# Patient Record
Sex: Female | Born: 1990 | Race: White | Hispanic: No | Smoking: Current every day smoker
Health system: Southern US, Community
[De-identification: ages and names within clinical notes are randomized; demographics above are authoritative.]

## PROBLEM LIST (undated history)

## (undated) ENCOUNTER — Inpatient Hospital Stay (HOSPITAL_COMMUNITY): Payer: Self-pay

## (undated) DIAGNOSIS — F32A Depression, unspecified: Secondary | ICD-10-CM

## (undated) DIAGNOSIS — R51 Headache: Secondary | ICD-10-CM

## (undated) DIAGNOSIS — D649 Anemia, unspecified: Secondary | ICD-10-CM

## (undated) DIAGNOSIS — R519 Headache, unspecified: Secondary | ICD-10-CM

## (undated) DIAGNOSIS — F329 Major depressive disorder, single episode, unspecified: Secondary | ICD-10-CM

## (undated) DIAGNOSIS — F319 Bipolar disorder, unspecified: Secondary | ICD-10-CM

## (undated) DIAGNOSIS — F419 Anxiety disorder, unspecified: Secondary | ICD-10-CM

## (undated) DIAGNOSIS — K219 Gastro-esophageal reflux disease without esophagitis: Secondary | ICD-10-CM

## (undated) HISTORY — PX: TYMPANOSTOMY TUBE PLACEMENT: SHX32

## (undated) HISTORY — DX: Bipolar disorder, unspecified: F31.9

---

## 2014-08-30 NOTE — L&D Delivery Note (Signed)
Patient is 24 y.o. [id number] [redacted]w[redacted]d admitted for PPROM three days ago, with subsequent IOL for suspected chorio.   Delivery Note At 2:55 PM a viable female was delivered via vaginal delivery (Presentation: occiput; anterior).  APGAR: 9, 7; weight 4 lb 5.1 oz (1960 g).   Placenta status: spontaneous; placental remnant was manually retrieved from uterus. 1000 mcg cytotec was subsequently administered rectally. Placenta was sent to pathology. Cord: 3 vessels with the following complications: none.    Anesthesia: Epidural  Episiotomy:  None Lacerations:  None Est. Blood Loss (mL):  500  Mom to postpartum.  Baby to NICU.  Tarri Abernethy, MD PGY-1 [facility]  05/14/2015, 3:21 PM

## 2014-12-05 LAB — OB RESULTS CONSOLE GC/CHLAMYDIA
CHLAMYDIA, DNA PROBE: NEGATIVE
Gonorrhea: NEGATIVE

## 2014-12-05 LAB — OB RESULTS CONSOLE ABO/RH: RH Type: POSITIVE

## 2015-03-06 ENCOUNTER — Encounter (HOSPITAL_COMMUNITY): Payer: Self-pay | Admitting: *Deleted

## 2015-03-06 ENCOUNTER — Encounter (HOSPITAL_COMMUNITY): Payer: Self-pay | Admitting: Nurse Practitioner

## 2015-03-06 ENCOUNTER — Emergency Department (HOSPITAL_COMMUNITY)
Admission: EM | Admit: 2015-03-06 | Discharge: 2015-03-06 | Disposition: A | Discharge status: Home / Self Care | Payer: Self-pay | Attending: Emergency Medicine | Admitting: Emergency Medicine

## 2015-03-06 ENCOUNTER — Inpatient Hospital Stay (HOSPITAL_COMMUNITY)
Admission: AD | Admit: 2015-03-06 | Discharge: 2015-03-07 | Disposition: A | Discharge status: Home / Self Care | Payer: Self-pay | Source: Ambulatory Visit | Attending: Family Medicine | Admitting: Family Medicine

## 2015-03-06 DIAGNOSIS — O4692 Antepartum hemorrhage, unspecified, second trimester: Secondary | ICD-10-CM | POA: Insufficient documentation

## 2015-03-06 DIAGNOSIS — O9989 Other specified diseases and conditions complicating pregnancy, childbirth and the puerperium: Secondary | ICD-10-CM | POA: Insufficient documentation

## 2015-03-06 DIAGNOSIS — O99332 Smoking (tobacco) complicating pregnancy, second trimester: Secondary | ICD-10-CM | POA: Insufficient documentation

## 2015-03-06 DIAGNOSIS — O99342 Other mental disorders complicating pregnancy, second trimester: Secondary | ICD-10-CM | POA: Insufficient documentation

## 2015-03-06 DIAGNOSIS — O26899 Other specified pregnancy related conditions, unspecified trimester: Secondary | ICD-10-CM

## 2015-03-06 DIAGNOSIS — O368120 Decreased fetal movements, second trimester, not applicable or unspecified: Secondary | ICD-10-CM | POA: Insufficient documentation

## 2015-03-06 DIAGNOSIS — N939 Abnormal uterine and vaginal bleeding, unspecified: Secondary | ICD-10-CM

## 2015-03-06 DIAGNOSIS — O212 Late vomiting of pregnancy: Secondary | ICD-10-CM | POA: Insufficient documentation

## 2015-03-06 DIAGNOSIS — Z79899 Other long term (current) drug therapy: Secondary | ICD-10-CM | POA: Insufficient documentation

## 2015-03-06 DIAGNOSIS — R109 Unspecified abdominal pain: Secondary | ICD-10-CM | POA: Insufficient documentation

## 2015-03-06 DIAGNOSIS — F17200 Nicotine dependence, unspecified, uncomplicated: Secondary | ICD-10-CM | POA: Insufficient documentation

## 2015-03-06 DIAGNOSIS — F329 Major depressive disorder, single episode, unspecified: Secondary | ICD-10-CM | POA: Insufficient documentation

## 2015-03-06 DIAGNOSIS — N949 Unspecified condition associated with female genital organs and menstrual cycle: Secondary | ICD-10-CM

## 2015-03-06 DIAGNOSIS — K625 Hemorrhage of anus and rectum: Secondary | ICD-10-CM

## 2015-03-06 DIAGNOSIS — Z3A22 22 weeks gestation of pregnancy: Secondary | ICD-10-CM | POA: Insufficient documentation

## 2015-03-06 HISTORY — DX: Headache: R51

## 2015-03-06 HISTORY — DX: Bipolar disorder, unspecified: F31.9

## 2015-03-06 HISTORY — DX: Anemia, unspecified: D64.9

## 2015-03-06 HISTORY — DX: Gastro-esophageal reflux disease without esophagitis: K21.9

## 2015-03-06 HISTORY — DX: Anxiety disorder, unspecified: F41.9

## 2015-03-06 HISTORY — DX: Major depressive disorder, single episode, unspecified: F32.9

## 2015-03-06 HISTORY — DX: Depression, unspecified: F32.A

## 2015-03-06 HISTORY — DX: Headache, unspecified: R51.9

## 2015-03-06 LAB — CBC WITH DIFFERENTIAL/PLATELET
BASOS ABS: 0 10*3/uL (ref 0.0–0.1)
BASOS PCT: 0 % (ref 0–1)
EOS PCT: 2 % (ref 0–5)
Eosinophils Absolute: 0.2 10*3/uL (ref 0.0–0.7)
HEMATOCRIT: 34.5 % — AB (ref 36.0–46.0)
Hemoglobin: 12 g/dL (ref 12.0–15.0)
Lymphocytes Relative: 29 % (ref 12–46)
Lymphs Abs: 3 10*3/uL (ref 0.7–4.0)
MCH: 30.2 pg (ref 26.0–34.0)
MCHC: 34.8 g/dL (ref 30.0–36.0)
MCV: 86.7 fL (ref 78.0–100.0)
MONO ABS: 0.7 10*3/uL (ref 0.1–1.0)
Monocytes Relative: 7 % (ref 3–12)
Neutro Abs: 6.5 10*3/uL (ref 1.7–7.7)
Neutrophils Relative %: 62 % (ref 43–77)
PLATELETS: 265 10*3/uL (ref 150–400)
RBC: 3.98 MIL/uL (ref 3.87–5.11)
RDW: 12.2 % (ref 11.5–15.5)
WBC: 10.4 10*3/uL (ref 4.0–10.5)

## 2015-03-06 LAB — WET PREP, GENITAL
CLUE CELLS WET PREP: NONE SEEN
TRICH WET PREP: NONE SEEN
YEAST WET PREP: NONE SEEN

## 2015-03-06 LAB — BASIC METABOLIC PANEL
ANION GAP: 8 (ref 5–15)
BUN: 6 mg/dL (ref 6–20)
CO2: 19 mmol/L — AB (ref 22–32)
Calcium: 8.5 mg/dL — ABNORMAL LOW (ref 8.9–10.3)
Chloride: 108 mmol/L (ref 101–111)
Creatinine, Ser: 0.49 mg/dL (ref 0.44–1.00)
GFR calc Af Amer: >60 mL/min (ref >60)
GFR calc non Af Amer: >60 mL/min (ref >60)
Glucose, Bld: 83 mg/dL (ref 65–99)
POTASSIUM: 3.4 mmol/L — AB (ref 3.5–5.1)
Sodium: 135 mmol/L (ref 135–145)

## 2015-03-06 MED ORDER — ONDANSETRON HCL 4 MG/2ML IJ SOLN
4.0000 mg | Freq: Once | INTRAMUSCULAR | Status: AC
  Administered 2015-03-06: 4 mg via INTRAVENOUS
  Filled 2015-03-06: qty 2

## 2015-03-06 MED ORDER — SODIUM CHLORIDE 0.9 % IV BOLUS (SEPSIS)
1000.0000 mL | Freq: Once | INTRAVENOUS | Status: DC
  Administered 2015-03-06: 1000 mL via INTRAVENOUS

## 2015-03-06 MED ORDER — SODIUM CHLORIDE 0.9 % IV BOLUS (SEPSIS)
1000.0000 mL | Freq: Once | INTRAVENOUS | Status: AC
  Administered 2015-03-06: 1000 mL via INTRAVENOUS

## 2015-03-06 NOTE — MAU Note (Signed)
Pt c/o blood in stool, bright red. Lower abdominal cramping. Denies vag bleeding. Moderate amount of mucous-white discharge. [facility].

## 2015-03-06 NOTE — Discharge Instructions (Signed)
Abdominal Pain During Pregnancy  Follow-up with the health department. Return for any vaginal bleeding or abdominal pain. Abdominal pain is common in pregnancy. Most of the time, it does not cause harm. There are many causes of abdominal pain. Some causes are more serious than others. Some of the causes of abdominal pain in pregnancy are easily diagnosed. Occasionally, the diagnosis takes time to understand. Other times, the cause is not determined. Abdominal pain can be a sign that something is very wrong with the pregnancy, or the pain may have nothing to do with the pregnancy at all. For this reason, always tell your health care provider if you have any abdominal discomfort. HOME CARE INSTRUCTIONS  Monitor your abdominal pain for any changes. The following actions may help to alleviate any discomfort you are experiencing:  Do not have sexual intercourse or put anything in your vagina until your symptoms go away completely.  Get plenty of rest until your pain improves.  Drink clear fluids if you feel nauseous. Avoid solid food as long as you are uncomfortable or nauseous.  Only take over-the-counter or prescription medicine as directed by your health care provider.  Keep all follow-up appointments with your health care provider. SEEK IMMEDIATE MEDICAL CARE IF:  You are bleeding, leaking fluid, or passing tissue from the vagina.  You have increasing pain or cramping.  You have persistent vomiting.  You have painful or bloody urination.  You have a fever.  You notice a decrease in your baby's movements.  You have extreme weakness or feel faint.  You have shortness of breath, with or without abdominal pain.  You develop a severe headache with abdominal pain.  You have abnormal vaginal discharge with abdominal pain.  You have persistent diarrhea.  You have abdominal pain that continues even after rest, or gets worse. MAKE SURE YOU:   Understand these instructions.  Will  watch your condition.  Will get help right away if you are not doing well or get worse. Document Released: 08/16/2005 Document Revised: 06/06/2013 Document Reviewed: 03/15/2013 [facility] Patient Information 2015 [employer] [place]. This information is not intended to replace advice given to you by your health care provider. Make sure you discuss any questions you have with your health care provider.

## 2015-03-06 NOTE — Discharge Instructions (Signed)
Please call the health department to set up prenatal care.

## 2015-03-06 NOTE — ED Notes (Addendum)
Pt accompanied to ER by mother, moved here from [place] this Sunday has been off of bipolar medications when in [place] for approximately 2 years. Patient denies SI/HI. Pt endorses low self-esteem, blaming and a los in her support system.  Patient [redacted] weeks pregnant, this is fourth pregnancy with one child- vaginal delivery.  endorses lower abdominal cramping and vaginal spotting. Denies clots and no longer feeling fetal movements. Pt doesn't have OBGYN here due to move and getting medicaid transferred  Pt endorses vomiting up bright red blood twice yesterday.

## 2015-03-06 NOTE — ED Notes (Signed)
This note also relates to the following rows which could not be included: BP - Cannot attach notes to unvalidated device data Pulse Rate - Cannot attach notes to unvalidated device data SpO2 - Cannot attach notes to unvalidated device data   Pt [place] having care in [place], then moved to [place] and also had pnc there, pt recently moved here and has had no pnc here. Pt reports having cramps over the past four days with vaginal bleeding-vaginal bleeding was heavy on the first day (7/3) and has since decreased to spotting, but pt reports still having to use a pad(was using tampons before today) due to the bleeding. Pt reports that the pain is intermittent, comes and goes in the abdomen and back. Pt reports that when she was ultrasounded that there was no mention of placenta previa or any problems. Pt reports hyperemesis in the beginning of pregnancy where a PICC line had to be placed for hydration; pt reports that PICC line was taken out about 1.225mos ago. Pt reports having one living child and 3 miscarriages.

## 2015-03-06 NOTE — MAU Provider Note (Signed)
History     CSN: [id number]  Arrival date and time: 03/06/15 2225   First Provider Initiated Contact with Patient 03/06/15 2252      Chief Complaint  Patient presents with  . Blood In Stools  . Abdominal Pain   HPI  OB History    Gravida Para Term Preterm AB TAB SAB Ectopic Multiple Living   0 Patient is 24 y.o. [id number] [redacted]w[redacted]d here with complaints of BRBPR, vaginal spotting, and abdominal cramping.  BRBPR: Had BM earlier today, loose stool, but saw spots of blood in toilet.  Has never seen blood in stool before.  Denies fevers, hematemesis, melena, medication use, or changes in appetite.  Abd cramping: Patient reports intermittent, sharp, bilateral lower abdominal cramping x4-5d.  It is better with laying on her side but occasionally disrupts sleep.  Later reports that the last time she felt FM was 1d ago.  It had previously been more frequent.  She also reports VB 3 days ago for which she used a tampon.  It resolved to just spotting this AM and seems to be completely gone now.  She denies any recent sexual intercourse.  denies LOF, vaginal discharge, contractions.  She recently (a few days ago) moved to the area from [place].  She was planning to call around to find a [facility] provider tomorrow AM.    Past Medical History  Diagnosis Date  . Bipolar 1 disorder   . Anemia   . Anxiety   . Depression   . GERD (gastroesophageal reflux disease)   . Headache     Past Surgical History  Procedure Laterality Date  . Tympanostomy tube placement      No family history on file.  History  Substance Use Topics  . Smoking status: Current Every Day Smoker -- 0.50 packs/day    Types: Cigarettes  . Smokeless tobacco: Never Used  . Alcohol Use: No    Allergies: No Known Allergies  Prescriptions prior to admission  Medication Sig Dispense Refill Last Dose  . HYDROcodone-acetaminophen (NORCO/VICODIN) 5-325 MG per tablet Take 1 tablet by mouth every 6 (six) hours  as needed for moderate pain.   03/06/2015 at Unknown time  . Prenatal Vit-Fe Fumarate-FA (PRENATAL FORMULA PO) Take 2 tablets by mouth 2 (two) times daily.   03/06/2015 at Unknown time    Review of Systems  Constitutional: Negative for fever, chills and weight loss.  HENT: Negative for congestion and nosebleeds.   Eyes: Negative for blurred vision, double vision, photophobia and pain.  Respiratory: Negative for cough, hemoptysis, shortness of breath and wheezing.   Cardiovascular: Negative for chest pain, palpitations and leg swelling.  Gastrointestinal: Positive for abdominal pain and blood in stool. Negative for heartburn, nausea, vomiting and melena.  Genitourinary: Negative for dysuria, urgency, frequency and hematuria.  Musculoskeletal: Negative.   Skin: Negative for itching and rash.  Neurological: Negative.  Negative for headaches.  Endo/Heme/Allergies: Negative.   Psychiatric/Behavioral: Negative.    Physical Exam   Last menstrual period 10/07/2014.  Physical Exam  Constitutional: She is oriented to person, place, and time. She appears well-developed and well-nourished. No distress.  HENT:  Head: Normocephalic and atraumatic.  Mouth/Throat: Oropharynx is clear and moist.  Eyes: EOM are normal. Pupils are equal, round, and reactive to light. No scleral icterus.  Neck: Normal range of motion. Neck supple.  Cardiovascular: Normal rate, regular rhythm, normal heart sounds and intact distal  pulses.   No murmur heard. Respiratory: Effort normal and breath sounds normal. No respiratory distress. She has no wheezes.  GI: Soft. Bowel sounds are normal. She exhibits no distension.  Uterus palpated above the umbilicus Mildly TTP in RLQ and LLQ, but distractable  Genitourinary:  GYN:  External genitalia within normal limits.  Vaginal mucosa pink, moist, normal rugae.  Nonfriable cervix without lesions, no discharge or bleeding noted on speculum exam.   Cervical exam: closed, thick,  high Rectal exam: No hemorrhoids palpated, no blood on glove    Musculoskeletal: Normal range of motion. She exhibits no edema or tenderness.  Neurological: She is alert and oriented to person, place, and time. No cranial nerve deficit.  Skin: Skin is warm and dry. No rash noted. No erythema.  Psychiatric: She has a normal mood and affect. Her behavior is normal. Thought content normal.    MAU Course  Procedures  MDM Fetal doppler with reassuring FHR Cervical exam reassuring and no blood seen on spec exam VSS CBC (Hgb 12), wet prep - [facility]  Assessment and Plan  Patient is 24 y.o. [id number] reporting BRBPR, vaginal spotting, abd cramping, decreased fetal movement likely secondary to hemorroidal bleeding, round ligament pain. - reassured patient that fetal movement is appropriate for GA - rectal bleeding miold and likely related to hemorroids - encouraged patient to take stool softener if not having regular BMs - No vaginal bleeding noted on speculum exam - discussed round ligament pain with patient - pregnancy verification letter given - patient to f/u with HD and apply for medicaid - preterm labor precautions  Erasmo DownerAngela M Bacigalupo, MD, MPH PGY-2,  [facility] 03/06/2015 11:28 PM   I have seen and examined this patient and agree the above assessment. CRESENZO-DISHMAN,Dorothymae Maciver 03/06/2015 11:42 PM

## 2015-03-06 NOTE — ED Provider Notes (Signed)
CSN: [id number]     Arrival date & time 03/06/15  0901 History   First MD Initiated Contact with Patient 03/06/15 [phone]     Chief Complaint  Patient presents with  . Depression  . Vaginal Bleeding     (Consider location/radiation/quality/duration/timing/severity/associated sxs/prior Treatment) The history is provided by the patient. No language interpreter was used.  Ms. Boggus is a 24 y.o [redacted] week pregnant female who presents with 2 days of lower abdominal cramping and vaginal spotting. She is also complaining of 2 episodes of nausea and hematemesis since yesterday.G5 P1 A3. Patient states her OB is in [place] and she was last seen 2 weeks ago. She is asking work for referral since she has just moved to [place]. She also is complaining of low self-esteem and loss of family support.she states she was taking bipolar medication for 2 years and would like to start something now. LMP: Beginning of February. She states that she had a previous miscarriage around 20 weeks. She denies fever, chills, chest pain, shortness of breath, cough, diarrhea, hematochezia, vaginal clots, dysuria, hematuria.  Past Medical History  Diagnosis Date  . Bipolar 1 disorder    History reviewed. No pertinent past surgical history. No family history on file. History  Substance Use Topics  . Smoking status: Current Every Day Smoker -- 0.50 packs/day    Types: Cigarettes  . Smokeless tobacco: Not on file  . Alcohol Use: No   OB History    Gravida Para Term Preterm AB TAB SAB Ectopic Multiple Living   0 Review of Systems  Constitutional: Negative for fever.  Gastrointestinal: Positive for nausea, vomiting and abdominal pain.  Genitourinary: Positive for vaginal bleeding. Negative for dysuria and urgency.  Neurological: Negative for dizziness and syncope.  All other systems reviewed and are negative.     Allergies  Review of patient's allergies indicates no known allergies.  Home  Medications   Prior to Admission medications   Medication Sig Start Date End Date Taking? Authorizing Provider  HYDROcodone-acetaminophen (NORCO/VICODIN) 5-325 MG per tablet Take 1 tablet by mouth every 6 (six) hours as needed for moderate pain.   Yes Historical Provider, MD  Prenatal Vit-Fe Fumarate-FA (PRENATAL FORMULA PO) Take 2 tablets by mouth 2 (two) times daily.   Yes Historical Provider, MD   BP 102/53 mmHg  Pulse 68  Temp(Src) 98.5 F (36.9 C) (Oral)  Resp 18  Ht  (1.676 m)  Wt 140 lb (63.504 kg)  BMI 22.61 kg/m2  SpO2 100%  LMP 10/07/2014 Physical Exam  Constitutional: She is oriented to person, place, and time. She appears well-developed and well-nourished.  HENT:  Head: Normocephalic and atraumatic.  Eyes: Conjunctivae are normal.  Neck: Normal range of motion.  Cardiovascular: Normal rate, regular rhythm and normal heart sounds.   Pulmonary/Chest: Effort normal and breath sounds normal. No respiratory distress. She has no wheezes. She has no rales.  Abdominal:  Gravid uterus. No abdominal tenderness.  Genitourinary: Pelvic exam was performed with patient supine.  Pelvic exam: Chaperone present. Cervical os closed and appears thick. Thin white and clear vaginal discharge. No vaginal bleeding. No adnexal tenderness. No digital vaginal exam was done. No rash or signs of STD.  Musculoskeletal: Normal range of motion.  Neurological: She is alert and oriented to person, place, and time.  Skin: Skin is warm and dry.  Psychiatric: She has a normal mood and affect. Her behavior is  normal.  Nursing note and vitals reviewed.   ED Course  Procedures (including critical care time) Labs Review Labs Reviewed  WET PREP, GENITAL - Abnormal; Notable for the following:    WBC, Wet Prep HPF POC FEW (*)    All other components within normal limits  CBC WITH DIFFERENTIAL/PLATELET - Abnormal; Notable for the following:    HCT 34.5 (*)    All other components within normal  limits  BASIC METABOLIC PANEL - Abnormal; Notable for the following:    Potassium 3.4 (*)    CO2 19 (*)    Calcium 8.5 (*)    All other components within normal limits  GC/CHLAMYDIA PROBE AMP ([place]) NOT AT [facility]    Imaging Review No results found.   EKG Interpretation None      MDM   Final diagnoses:  Abdominal cramping affecting pregnancy  Doppler of fetal heart rate 155 bpm. OB nurse at bedside who has done digital exam on patient to check for cervical size in thickness. Fetal heart tones were normal on her exam. On my pelvic exam the patient had no bleeding in the cervical os was closed. OB nurse working with Dr. Shawnie PonsPratt stated that the patient can follow up with the health department and to give her preterm labor precautions. I discussed labs. Hemoglobin stable. Patient has Zofran home. I explained that she could discuss with her new OB/GYN about her social issues and concern for depression. She has no SI , HI, hallucinations. She states she took Wellbutrin but has been over 2 years now. I explained to the hospital would call her in the next few days regarding her GC chlamydia results if they are positive. Patient and she verbally agrees with the plan.     Catha GosselinHanna Patel-Mills, PA-C 03/06/15 1351  Linwood DibblesJon Knapp, MD 03/07/15 [phone]

## 2015-03-06 NOTE — Progress Notes (Signed)
RROB spoke with Dr Shawnie PonsPratt, told of pt complaints, history, physical exam; orders to check cervix and if cervix long thick and closed, then pt may go home with ptl precautions and advised to set up appt for pnc with health department. RROB gave pt and pt's mother ptl precautions and told to make appt; pt and mother verbalized understanding.

## 2015-03-07 LAB — GC/CHLAMYDIA PROBE AMP (~~LOC~~) NOT AT ARMC
Chlamydia: NEGATIVE
Neisseria Gonorrhea: NEGATIVE

## 2015-03-16 ENCOUNTER — Emergency Department (HOSPITAL_COMMUNITY)
Admission: EM | Admit: 2015-03-16 | Discharge: 2015-03-16 | Disposition: A | Discharge status: Home / Self Care | Payer: Self-pay | Attending: Emergency Medicine | Admitting: Emergency Medicine

## 2015-03-16 ENCOUNTER — Encounter (HOSPITAL_COMMUNITY): Payer: Self-pay | Admitting: Emergency Medicine

## 2015-03-16 DIAGNOSIS — R109 Unspecified abdominal pain: Secondary | ICD-10-CM

## 2015-03-16 DIAGNOSIS — Z3A24 24 weeks gestation of pregnancy: Secondary | ICD-10-CM | POA: Insufficient documentation

## 2015-03-16 DIAGNOSIS — F17210 Nicotine dependence, cigarettes, uncomplicated: Secondary | ICD-10-CM | POA: Insufficient documentation

## 2015-03-16 DIAGNOSIS — Z8659 Personal history of other mental and behavioral disorders: Secondary | ICD-10-CM | POA: Insufficient documentation

## 2015-03-16 DIAGNOSIS — O99332 Smoking (tobacco) complicating pregnancy, second trimester: Secondary | ICD-10-CM | POA: Insufficient documentation

## 2015-03-16 DIAGNOSIS — O9989 Other specified diseases and conditions complicating pregnancy, childbirth and the puerperium: Secondary | ICD-10-CM | POA: Insufficient documentation

## 2015-03-16 DIAGNOSIS — Z862 Personal history of diseases of the blood and blood-forming organs and certain disorders involving the immune mechanism: Secondary | ICD-10-CM | POA: Insufficient documentation

## 2015-03-16 DIAGNOSIS — N898 Other specified noninflammatory disorders of vagina: Secondary | ICD-10-CM | POA: Insufficient documentation

## 2015-03-16 DIAGNOSIS — O0932 Supervision of pregnancy with insufficient antenatal care, second trimester: Secondary | ICD-10-CM

## 2015-03-16 DIAGNOSIS — Z3492 Encounter for supervision of normal pregnancy, unspecified, second trimester: Secondary | ICD-10-CM

## 2015-03-16 DIAGNOSIS — K921 Melena: Secondary | ICD-10-CM | POA: Insufficient documentation

## 2015-03-16 DIAGNOSIS — O99612 Diseases of the digestive system complicating pregnancy, second trimester: Secondary | ICD-10-CM | POA: Insufficient documentation

## 2015-03-16 LAB — URINALYSIS, ROUTINE W REFLEX MICROSCOPIC
Bilirubin Urine: NEGATIVE
Glucose, UA: NEGATIVE mg/dL
HGB URINE DIPSTICK: NEGATIVE
Ketones, ur: NEGATIVE mg/dL
Leukocytes, UA: NEGATIVE
NITRITE: NEGATIVE
PH: 7 (ref 5.0–8.0)
Protein, ur: NEGATIVE mg/dL
SPECIFIC GRAVITY, URINE: 1.025 (ref 1.005–1.030)
Urobilinogen, UA: 0.2 mg/dL (ref 0.0–1.0)

## 2015-03-16 NOTE — Discharge Instructions (Signed)

## 2015-03-16 NOTE — ED Notes (Addendum)
Pt. reports abdominal contractions radiating to lower back onset this evening , denies vaginal spotting or discharge , denies emesis ,  endorses intermittent bloody diarrhea onset last week . No fever or chills. Pt. states she is [redacted] weeks pregnant (G5P1).

## 2015-03-16 NOTE — ED Notes (Signed)
Report to Dr Sherryl BartersFergusen, cleared obstetrically, pt to push fluids and if urine shows dehydration IV fluid should be given prior to d/c.

## 2015-03-16 NOTE — ED Notes (Signed)
PT unable to provide UA at this time... 

## 2015-03-16 NOTE — ED Provider Notes (Signed)
Patient presented to the ER with possible contractions. Patient is [redacted] weeks pregnant. She has not had prenatal care in this area, has moved here from [place].  Face to face Exam: HEENT - PERRLA Lungs - CTAB Heart - RRR, no M/R/G Abd - S/NT/ND Neuro - alert, oriented x3  Plan: OB has cleared the patient from an obstectric standpoint, arrange outpatient care.  Gilda Creasehristopher J Pollina, MD 03/16/15 2101

## 2015-03-16 NOTE — ED Provider Notes (Signed)
CSN: [id number]     Arrival date & time 03/16/15  1922 History   First MD Initiated Contact with Patient 03/16/15 1923     Chief Complaint  Patient presents with  . Contractions   (Consider location/radiation/quality/duration/timing/severity/associated sxs/prior Treatment) Patient is a 24 y.o. female presenting with abdominal pain. The history is provided by the patient. No language interpreter was used.  Abdominal Pain Pain location:  Generalized Pain quality: cramping   Pain radiation: lower back pain. Pain severity:  Mild Onset quality:  Gradual Timing:  Intermittent Progression:  Waxing and waning Chronicity:  New Context comment:  [redacted] weeks pregnant Relieved by:  Nothing Worsened by:  Nothing tried Ineffective treatments:  None tried Associated symptoms: nausea and vaginal discharge   Associated symptoms: no anorexia, no chest pain, no chills, no constipation, no cough, no diarrhea, no dysuria, no fatigue, no fever, no melena, no shortness of breath, no vaginal bleeding and no vomiting   Risk factors: pregnancy     Past Medical History  Diagnosis Date  . Bipolar 1 disorder   . Anemia   . Anxiety   . Depression   . GERD (gastroesophageal reflux disease)   . Headache    Past Surgical History  Procedure Laterality Date  . Tympanostomy tube placement     No family history on file. History  Substance Use Topics  . Smoking status: Current Every Day Smoker -- 0.00 packs/day    Types: Cigarettes  . Smokeless tobacco: Never Used  . Alcohol Use: No   OB History    Gravida Para Term Preterm AB TAB SAB Ectopic Multiple Living   0 Review of Systems  Constitutional: Negative for fever, chills, diaphoresis and fatigue.  Respiratory: Negative for cough, chest tightness, shortness of breath and wheezing.   Cardiovascular: Negative for chest pain and palpitations.  Gastrointestinal: Positive for nausea, abdominal pain and blood in stool. Negative for  vomiting, diarrhea, constipation, melena and anorexia.  Genitourinary: Positive for vaginal discharge. Negative for dysuria and vaginal bleeding.  Neurological: Negative for weakness, light-headedness and headaches.  Psychiatric/Behavioral: Negative for confusion.  All other systems reviewed and are negative.   Allergies  Review of patient's allergies indicates no known allergies.  Home Medications   Prior to Admission medications   Medication Sig Start Date End Date Taking? Authorizing Provider  Prenatal Vit-Fe Fumarate-FA (PRENATAL FORMULA PO) Take 2 tablets by mouth 2 (two) times daily.   Yes Historical Provider, MD   BP 113/72 mmHg  Pulse 76  Temp(Src) 98 F (36.7 C) (Oral)  Resp 19  SpO2 99%  LMP 10/07/2014 Physical Exam  Constitutional: She is oriented to person, place, and time. Vital signs are normal. She appears well-developed and well-nourished. She does not appear ill. No distress.  HENT:  Head: Normocephalic and atraumatic.  Nose: Nose normal.  Mouth/Throat: Oropharynx is clear and moist. No oropharyngeal exudate.  Eyes: EOM are normal. Pupils are equal, round, and reactive to light.  Neck: Normal range of motion. Neck supple.  Cardiovascular: Normal rate, regular rhythm, normal heart sounds and intact distal pulses.   No murmur heard. Pulmonary/Chest: Effort normal and breath sounds normal. No respiratory distress. She has no wheezes. She exhibits no tenderness.  Abdominal: Soft. There is no tenderness. There is no rebound and no guarding.  Fundus palpable just above umbilicus, consistent with gestational age.  Nontender to palpation.  Palpable fetal movement.    Musculoskeletal:  Normal range of motion. She exhibits no tenderness.  Lymphadenopathy:    She has no cervical adenopathy.  Neurological: She is alert and oriented to person, place, and time. No cranial nerve deficit. Coordination normal.  Skin: Skin is warm and dry. She is not diaphoretic.  Psychiatric:  She has a normal mood and affect. Her behavior is normal. Judgment and thought content normal.  Nursing note and vitals reviewed.   ED Course  Procedures (including critical care time) Labs Review Labs Reviewed  URINE CULTURE  URINALYSIS, ROUTINE W REFLEX MICROSCOPIC (NOT AT [facility])   Imaging Review No results found.   EKG Interpretation None      MDM   Final diagnoses:  Second trimester pregnancy  Abdominal cramping  Prenatal care insufficient, second trimester   Pt is a 24 yo G5P1031 at [redacted] weeks pregnant who presents complaining of loss of mucous plug and diffuse contractions.  Complains of diffuse cramping at lower abdominal cramping that radiates to her back.  Started this afternoon and has continued intermittently throughout the day.  Also had an episode of discharge in her underwear that she was concerned for a possible mucous plug, as she lost a moderate amount of yellow/brown slimy discharge.  No other discharge throughout the day.  No vaginal bleeding, no gush of fluid. Has had intermittent OB care throughout her pregnancy.  Was seen in [place] initially, but has not established care since [place] [facility]  [facility] for vaginal spotting and hemorrhoids.  Wet prep and Gc/Cz were negative at that time (7/7).    Pt is well appearing, in NAD. Fundus palpable just above umbilicus, consistent with gestational age.  OB RN paged to bedside.   Reassuring fetal heart tones on toco.   Cleared by OBGYN after reviewing her toco strips.   UA shows no signs of infection.  She was encouraged to drink plenty of fluid.  Could be [employer]xton Hicks contractions.  Advised to stop smoking and avoid alcohol.  Encouraged to call the women's clinic to get a follow up appointment and to continue taking her prenatal vitamins.  All questions were answered and ED return precautions were discussed.    If performed, labs, EKGs, and imaging were reviewed and  interpreted by myself and my attending, and incorporated in the medical decision making.  Patient was seen with ED Attending, Dr. Nada MaclachlanPollina  Eulas Schweitzer, MD    Lenell AntuJamie Javia Dillow, MD 03/18/15 0127  Gilda Creasehristopher J Pollina, MD 03/20/15 [phone]

## 2015-03-18 LAB — URINE CULTURE

## 2015-03-21 ENCOUNTER — Encounter (HOSPITAL_COMMUNITY): Payer: Self-pay

## 2015-03-21 ENCOUNTER — Inpatient Hospital Stay (HOSPITAL_COMMUNITY)
Admission: AD | Admit: 2015-03-21 | Discharge: 2015-03-22 | Disposition: A | Discharge status: Home / Self Care | Payer: Self-pay | Source: Ambulatory Visit | Attending: Obstetrics and Gynecology | Admitting: Obstetrics and Gynecology

## 2015-03-21 DIAGNOSIS — O99332 Smoking (tobacco) complicating pregnancy, second trimester: Secondary | ICD-10-CM | POA: Insufficient documentation

## 2015-03-21 DIAGNOSIS — O0932 Supervision of pregnancy with insufficient antenatal care, second trimester: Secondary | ICD-10-CM | POA: Insufficient documentation

## 2015-03-21 DIAGNOSIS — R109 Unspecified abdominal pain: Secondary | ICD-10-CM | POA: Insufficient documentation

## 2015-03-21 DIAGNOSIS — O9989 Other specified diseases and conditions complicating pregnancy, childbirth and the puerperium: Secondary | ICD-10-CM | POA: Insufficient documentation

## 2015-03-21 DIAGNOSIS — O26899 Other specified pregnancy related conditions, unspecified trimester: Secondary | ICD-10-CM

## 2015-03-21 DIAGNOSIS — F17210 Nicotine dependence, cigarettes, uncomplicated: Secondary | ICD-10-CM | POA: Insufficient documentation

## 2015-03-21 DIAGNOSIS — Z3A25 25 weeks gestation of pregnancy: Secondary | ICD-10-CM | POA: Insufficient documentation

## 2015-03-21 LAB — URINALYSIS, ROUTINE W REFLEX MICROSCOPIC
Glucose, UA: NEGATIVE mg/dL
Hgb urine dipstick: NEGATIVE
KETONES UR: NEGATIVE mg/dL
Leukocytes, UA: NEGATIVE
Nitrite: NEGATIVE
Protein, ur: NEGATIVE mg/dL
SPECIFIC GRAVITY, URINE: 1.025 (ref 1.005–1.030)
Urobilinogen, UA: 0.2 mg/dL (ref 0.0–1.0)
pH: 6.5 (ref 5.0–8.0)

## 2015-03-21 LAB — DIFFERENTIAL
Basophils Absolute: 0 10*3/uL (ref 0.0–0.1)
Basophils Relative: 0 % (ref 0–1)
EOS ABS: 0.2 10*3/uL (ref 0.0–0.7)
Eosinophils Relative: 1 % (ref 0–5)
LYMPHS ABS: 3.3 10*3/uL (ref 0.7–4.0)
LYMPHS PCT: 22 % (ref 12–46)
MONOS PCT: 5 % (ref 3–12)
Monocytes Absolute: 0.8 10*3/uL (ref 0.1–1.0)
Neutro Abs: 11 10*3/uL — ABNORMAL HIGH (ref 1.7–7.7)
Neutrophils Relative %: 72 % (ref 43–77)

## 2015-03-21 LAB — CBC
HCT: 35.7 % — ABNORMAL LOW (ref 36.0–46.0)
HEMOGLOBIN: 12.5 g/dL (ref 12.0–15.0)
MCH: 30.6 pg (ref 26.0–34.0)
MCHC: 35 g/dL (ref 30.0–36.0)
MCV: 87.5 fL (ref 78.0–100.0)
PLATELETS: 244 10*3/uL (ref 150–400)
RBC: 4.08 MIL/uL (ref 3.87–5.11)
RDW: 12.4 % (ref 11.5–15.5)
WBC: 15.4 10*3/uL — AB (ref 4.0–10.5)

## 2015-03-21 LAB — WET PREP, GENITAL
Trich, Wet Prep: NONE SEEN
Yeast Wet Prep HPF POC: NONE SEEN

## 2015-03-21 NOTE — MAU Note (Signed)
Cramping started yesterday which is intermittent, no vaginal bleeding, white mucus discharge, no prenatal care other than MAU visits, no dysuria, no constipation.

## 2015-03-21 NOTE — Discharge Instructions (Signed)
Second Trimester of Pregnancy °The second trimester is from week 13 through week 28, months 4 through 6. The second trimester is often a time when you feel your best. Your body has also adjusted to being pregnant, and you begin to feel better physically. Usually, morning sickness has lessened or quit completely, you may have more energy, and you may have an increase in appetite. The second trimester is also a time when the fetus is growing rapidly. At the end of the sixth month, the fetus is about 9 inches long and weighs about 1½ pounds. You will likely begin to feel the baby move (quickening) between 18 and 20 weeks of the pregnancy. °BODY CHANGES °Your body goes through many changes during pregnancy. The changes vary from woman to woman.  °· Your weight will continue to increase. You will notice your lower abdomen bulging out. °· You may begin to get stretch marks on your hips, abdomen, and breasts. °· You may develop headaches that can be relieved by medicines approved by your health care provider. °· You may urinate more often because the fetus is pressing on your bladder. °· You may develop or continue to have heartburn as a result of your pregnancy. °· You may develop constipation because certain hormones are causing the muscles that push waste through your intestines to slow down. °· You may develop hemorrhoids or swollen, bulging veins (varicose veins). °· You may have back pain because of the weight gain and pregnancy hormones relaxing your joints between the bones in your pelvis and as a result of a shift in weight and the muscles that support your balance. °· Your breasts will continue to grow and be tender. °· Your gums may bleed and may be sensitive to brushing and flossing. °· Dark spots or blotches (chloasma, mask of pregnancy) may develop on your face. This will likely fade after the baby is born. °· A dark line from your belly button to the pubic area (linea nigra) may appear. This will likely fade  after the baby is born. °· You may have changes in your hair. These can include thickening of your hair, rapid growth, and changes in texture. Some women also have hair loss during or after pregnancy, or hair that feels dry or thin. Your hair will most likely return to normal after your baby is born. °WHAT TO EXPECT AT YOUR PRENATAL VISITS °During a routine prenatal visit: °· You will be weighed to make sure you and the fetus are growing normally. °· Your blood pressure will be taken. °· Your abdomen will be measured to track your baby's growth. °· The fetal heartbeat will be listened to. °· Any test results from the previous visit will be discussed. °Your health care provider may ask you: °· How you are feeling. °· If you are feeling the baby move. °· If you have had any abnormal symptoms, such as leaking fluid, bleeding, severe headaches, or abdominal cramping. °· If you have any questions. °Other tests that may be performed during your second trimester include: °· Blood tests that check for: °¨ Low iron levels (anemia). °¨ Gestational diabetes (between 24 and 28 weeks). °¨ Rh antibodies. °· Urine tests to check for infections, diabetes, or protein in the urine. °· An ultrasound to confirm the proper growth and development of the baby. °· An amniocentesis to check for possible genetic problems. °· Fetal screens for spina bifida and Down syndrome. °HOME CARE INSTRUCTIONS  °· Avoid all smoking, herbs, alcohol, and unprescribed   drugs. These chemicals affect the formation and growth of the baby. °· Follow your health care provider's instructions regarding medicine use. There are medicines that are either safe or unsafe to take during pregnancy. °· Exercise only as directed by your health care provider. Experiencing uterine cramps is a good sign to stop exercising. °· Continue to eat regular, healthy meals. °· Wear a good support bra for breast tenderness. °· Do not use hot tubs, steam rooms, or saunas. °· Wear your  seat belt at all times when driving. °· Avoid raw meat, uncooked cheese, cat litter boxes, and soil used by cats. These carry germs that can cause birth defects in the baby. °· Take your prenatal vitamins. °· Try taking a stool softener (if your health care provider approves) if you develop constipation. Eat more high-fiber foods, such as fresh vegetables or fruit and whole grains. Drink plenty of fluids to keep your urine clear or pale yellow. °· Take warm sitz baths to soothe any pain or discomfort caused by hemorrhoids. Use hemorrhoid cream if your health care provider approves. °· If you develop varicose veins, wear support hose. Elevate your feet for 15 minutes, 3-4 times a day. Limit salt in your diet. °· Avoid heavy lifting, wear low heel shoes, and practice good posture. °· Rest with your legs elevated if you have leg cramps or low back pain. °· Visit your dentist if you have not gone yet during your pregnancy. Use a soft toothbrush to brush your teeth and be gentle when you floss. °· A sexual relationship may be continued unless your health care provider directs you otherwise. °· Continue to go to all your prenatal visits as directed by your health care provider. °SEEK MEDICAL CARE IF:  °· You have dizziness. °· You have mild pelvic cramps, pelvic pressure, or nagging pain in the abdominal area. °· You have persistent nausea, vomiting, or diarrhea. °· You have a bad smelling vaginal discharge. °· You have pain with urination. °SEEK IMMEDIATE MEDICAL CARE IF:  °· You have a fever. °· You are leaking fluid from your vagina. °· You have spotting or bleeding from your vagina. °· You have severe abdominal cramping or pain. °· You have rapid weight gain or loss. °· You have shortness of breath with chest pain. °· You notice sudden or extreme swelling of your face, hands, ankles, feet, or legs. °· You have not felt your baby move in over an hour. °· You have severe headaches that do not go away with  medicine. °· You have vision changes. °Document Released: 08/10/2001 Document Revised: 08/21/2013 Document Reviewed: 10/17/2012 °ExitCare® Patient Information ©2015 [employer]. This information is not intended to replace advice given to you by your health care provider. Make sure you discuss any questions you have with your health care provider. °Preterm Labor Information °Preterm labor is when labor starts at less than 37 weeks of pregnancy. The normal length of a pregnancy is 39 to 41 weeks. °CAUSES °Often, there is no identifiable underlying cause as to why a woman goes into preterm labor. One of the most common known causes of preterm labor is infection. Infections of the uterus, cervix, vagina, amniotic sac, bladder, kidney, or even the lungs (pneumonia) can cause labor to start. Other suspected causes of preterm labor include:  °· Urogenital infections, such as yeast infections and bacterial vaginosis.   °· Uterine abnormalities (uterine shape, uterine septum, fibroids, or bleeding from the placenta).   °· A cervix that has been operated on (it   may fail to stay closed).   °· Malformations in the fetus.   °· Multiple gestations (twins, triplets, and so on).   °· Breakage of the amniotic sac.   °RISK FACTORS °· Having a previous history of preterm labor.   °· Having premature rupture of membranes (PROM).   °· Having a placenta that covers the opening of the cervix (placenta previa).   °· Having a placenta that separates from the uterus (placental abruption).   °· Having a cervix that is too weak to hold the fetus in the uterus (incompetent cervix).   °· Having too much fluid in the amniotic sac (polyhydramnios).   °· Taking illegal drugs or smoking while pregnant.   °· Not gaining enough weight while pregnant.   °· Being younger than 18 and older than 23 years old.   °· Having a low socioeconomic status.   °· Being African American. °SYMPTOMS °Signs and symptoms of preterm labor include:  °· Menstrual-like  cramps, abdominal pain, or back pain. °· Uterine contractions that are regular, as frequent as six in an hour, regardless of their intensity (may be mild or painful). °· Contractions that start on the top of the uterus and spread down to the lower abdomen and back.   °· A sense of increased pelvic pressure.   °· A watery or bloody mucus discharge that comes from the vagina.   °TREATMENT °Depending on the length of the pregnancy and other circumstances, your health care provider may suggest bed rest. If necessary, there are medicines that can be given to stop contractions and to mature the fetal lungs. If labor happens before 34 weeks of pregnancy, a prolonged hospital stay may be recommended. Treatment depends on the condition of both you and the fetus.  °WHAT SHOULD YOU DO IF YOU THINK YOU ARE IN PRETERM LABOR? °Call your health care provider right away. You will need to go to the hospital to get checked immediately. °HOW CAN YOU PREVENT PRETERM LABOR IN FUTURE PREGNANCIES? °You should:  °· Stop smoking if you smoke.  °· Maintain healthy weight gain and avoid chemicals and drugs that are not necessary. °· Be watchful for any type of infection. °· Inform your health care provider if you have a known history of preterm labor. °Document Released: 11/06/2003 Document Revised: 04/18/2013 Document Reviewed: 09/18/2012 °ExitCare® Patient Information ©2015 [employer]. This information is not intended to replace advice given to you by your health care provider. Make sure you discuss any questions you have with your health care provider. ° °

## 2015-03-21 NOTE — H&P (Deleted)
History     CSN: [id number]  Arrival date and time: 03/21/15 2121   First Provider Initiated Contact with Patient 03/21/15 2205      Chief Complaint  Patient presents with  . Abdominal Cramping   HPI: Yolanda Baird is a 24 year old G5P1031 that is 25w, that presents today with a feeling of pressure and cramping that started last night prior to going to sleep. She has not been able to sleep well throughout the night due to the cramping which she says comes and goes but is unsure of how long the episodes last. The cramping pain starts at the bladder and radiates up both sides. She rates the cramping pain as a 5/10. She has tried 2 x  Tylenol, heating pad, ice packs, and positional changes none of which help with her pain. Denies any constipation, diarrhea, vomiting, fever, vaginal bleeding. Endorses, vaginal discharge, nausea, night sweats even with thermostat set to 65F and a fan in the room and a headache that started at 3pm yesterday with associated photosensitivity that she states she is still having, pain 10/10 for headache. No recent travel or known sick contacts. Prior history of blood in stools on last MAU visit here about a week ago, but denies any blood in stool and last BM was around 6pm and she didn't notice any blood in her stools.  FHR: 130bpm  OB History    Gravida Para Term Preterm AB TAB SAB Ectopic Multiple Living   0 Past Medical History  Diagnosis Date  . Bipolar 1 disorder   . Anemia   . Anxiety   . Depression   . GERD (gastroesophageal reflux disease)   . Headache     Past Surgical History  Procedure Laterality Date  . Tympanostomy tube placement      No family history on file.  History  Substance Use Topics  . Smoking status: Current Every Day Smoker -- 0.00 packs/day    Types: Cigarettes  . Smokeless tobacco: Never Used  . Alcohol Use: No    Allergies: No Known Allergies  Prescriptions prior to admission  Medication Sig  Dispense Refill Last Dose  . Prenatal Vit-Fe Fumarate-FA (PRENATAL FORMULA PO) Take 2 tablets by mouth 2 (two) times daily.   03/16/2015 at Unknown time    Review of Systems  Constitutional: Negative for fever.       Endorses night sweats  Eyes: Positive for photophobia.  Respiratory: Negative for cough.   Cardiovascular: Negative for chest pain and leg swelling.  Gastrointestinal: Positive for nausea and abdominal pain. Negative for vomiting, diarrhea, constipation and blood in stool.  Genitourinary: Negative for dysuria and hematuria.  Neurological: Positive for headaches.   Physical Exam   Blood pressure 102/58, pulse 84, temperature 97.8 F (36.6 C), temperature source Oral, resp. rate 16, last menstrual period 10/07/2014.  Physical Exam  Constitutional: She is oriented to person, place, and time. She appears well-developed and well-nourished.  HENT:  Head: Normocephalic and atraumatic.  Eyes: Conjunctivae and EOM are normal.  Cardiovascular: Normal rate, regular rhythm and normal heart sounds.   Respiratory: Effort normal and breath sounds normal. No respiratory distress.  GI: Soft. Bowel sounds are normal.  Musculoskeletal: She exhibits no edema.  Neurological: She is alert and oriented to person, place, and time.  Skin: Skin is warm and dry.  Psychiatric: She has a normal mood and affect. Her behavior is normal.  Judgment and thought content normal.    MAU Course  Procedures  MDM  Speculum exam completed by Ms. Dorathy Kinsman, CNM  Assessment and Plan  A&P: [name] is a 24 year old, G5P1031 that is [redacted]w[redacted]d with no prenatal care other than an U/S done by doctor in [place] to determine gender of baby. She is currently having some cramping pain that is rates at a 5/10 and a headache but is hemodynamically stable. She did do a self-cervical exam during her visit her today in the MAU(03/21/2015) so obtaining a FFN is not advised due to increased risk of false positive. UA/Wet  prep are indicated to rule out infectious etiology of cramping. Patient also will be given information regarding locations to receive prenatal care, prenatal labs will be drawn during today's visit and she can follow up with provider of her choice at the other sites.  Kandyce Rud.  Medical student 03/21/2015, 10:36 PM

## 2015-03-22 LAB — ABO/RH: ABO/RH(D): B POS

## 2015-03-22 LAB — HIV ANTIBODY (ROUTINE TESTING W REFLEX): HIV SCREEN 4TH GENERATION: NONREACTIVE

## 2015-03-22 LAB — TYPE AND SCREEN
ABO/RH(D): B POS
ANTIBODY SCREEN: NEGATIVE

## 2015-03-22 LAB — RPR: RPR Ser Ql: NONREACTIVE

## 2015-03-22 LAB — HEPATITIS B SURFACE ANTIGEN: HEP B S AG: NEGATIVE

## 2015-03-23 LAB — RUBELLA SCREEN: RUBELLA: 2.52 {index} (ref >0.99)

## 2015-03-25 NOTE — MAU Provider Note (Signed)
History     CSN: [id number]  Arrival date and time: 03/21/15 2121  First Provider Initiated Contact with Patient 03/21/15 2205    Chief Complaint  Patient presents with  . Abdominal Cramping   HPI: Yolanda Baird is a 24 year old G5P1031 that is 25w, that presents today with a feeling of pressure and cramping that started last night prior to going to sleep. She has not been able to sleep well throughout the night due to the cramping which she says comes and goes but is unsure of how long the episodes last. The cramping pain starts at the bladder and radiates up both sides. She rates the cramping pain as a 5/10. She has tried 2 x 500mg  Tylenol, heating pad, ice packs, and positional changes none of which help with her pain. Denies any constipation, diarrhea, vomiting, fever, vaginal bleeding. Endorses, vaginal discharge, nausea, night sweats even with thermostat set to 23F and a fan in the room and a headache that started at 3pm yesterday with associated photosensitivity that she states she is still having, pain 10/10 for headache. No recent travel or known sick contacts. Prior history of blood in stools on last MAU visit here about a week ago, but denies any blood in stool and last BM was around 6pm and she didn't notice any blood in her stools.  GC/Chlamydia neg 03/06/15  OB History    Gravida Para Term Preterm AB TAB SAB Ectopic Multiple Living   5 1 1  0 3  3   1       Past Medical History  Diagnosis Date  . Bipolar 1 disorder   . Anemia   . Anxiety   . Depression   . GERD (gastroesophageal reflux disease)   . Headache     Past Surgical History  Procedure Laterality Date  . Tympanostomy tube placement      No family history on file.  History  Substance Use Topics  . Smoking status: Current Every Day Smoker -- 0.00 packs/day    Types: Cigarettes  . Smokeless tobacco: Never Used  . Alcohol Use:  No    Allergies: No Known Allergies  Prescriptions prior to admission  Medication Sig Dispense Refill Last Dose  . Prenatal Vit-Fe Fumarate-FA (PRENATAL FORMULA PO) Take 2 tablets by mouth 2 (two) times daily.   03/16/2015 at Unknown time    Review of Systems  Constitutional: Negative for fever.   Endorses night sweats  Eyes: Positive for photophobia.  Respiratory: Negative for cough.  Cardiovascular: Negative for chest pain and leg swelling.  Gastrointestinal: Positive for nausea and abdominal pain. Negative for vomiting, diarrhea, constipation and blood in stool.  Genitourinary: Negative for dysuria and hematuria.  Neurological: Positive for headaches.   Physical Exam   Blood pressure 102/58, pulse 84, temperature 97.8 F (36.6 C), temperature source Oral, resp. rate 16, last menstrual period 10/07/2014.  Physical Exam  Constitutional: She is oriented to person, place, and time. She appears well-developed and well-nourished.  HENT:  Head: Normocephalic and atraumatic.  Eyes: Conjunctivae and EOM are normal.  Cardiovascular: Normal rate, regular rhythm and normal heart sounds.  Respiratory: Effort normal and breath sounds normal. No respiratory distress.  GI: Soft. Bowel sounds are normal.  Musculoskeletal: She exhibits no edema.  Neurological: She is alert and oriented to person, place, and time.  Skin: Skin is warm and dry.  Psychiatric: She has a normal mood and affect. Her behavior is normal. Judgment and thought content normal.  Pelvic exam:  NEFG, Physiologic discharge. No bleeding. Adnexa NT, no masses.  Dilation: Closed Effacement (%): Thick Exam by:: Yolanda Baird CNM  EFM: 130 mod variability, 15x15 accels, no decels TocoL: UI  MAU Course  Procedures  Speculum exam, Prenatal panel, Wet Prep, UA, Urine culture. Request records.    MDM Yolanda Baird is a 24 year old, G5P1031 that is [redacted]w[redacted]d with no prenatal care other than an U/S done by  doctor in [place] to determine gender of baby. She is currently having some cramping pain that is rates at a 5/10 and a headache but is hemodynamically stable. She did do a self-cervical exam during her visit her today in the MAU(03/21/2015) so obtaining a FFN is not advised due to increased risk of false positive. UA/Wet prep are indicated to rule out infectious etiology of cramping.  Assessment and Plan  A&P:  1. Cramping complicating pregnancy, antepartum   2. No Prenatal care  List of providers given. In-basket message sent to [facility] since Pregnancy Medicaid no active. PTL precautions, FKCs Increase fluids and rest Pelvic rest x 1 week.   Medication List    TAKE these medications        PRENATAL FORMULA PO  Take 2 tablets by mouth 2 (two) times daily.         [place], [place] 03/25/2015 2:41 PM

## 2015-04-01 ENCOUNTER — Inpatient Hospital Stay (HOSPITAL_COMMUNITY)
Admission: AD | Admit: 2015-04-01 | Discharge: 2015-04-01 | Disposition: A | Discharge status: Home / Self Care | Payer: Medicaid Other | Source: Ambulatory Visit | Attending: Family Medicine | Admitting: Family Medicine

## 2015-04-01 DIAGNOSIS — O9989 Other specified diseases and conditions complicating pregnancy, childbirth and the puerperium: Secondary | ICD-10-CM | POA: Insufficient documentation

## 2015-04-01 DIAGNOSIS — R102 Pelvic and perineal pain: Secondary | ICD-10-CM | POA: Insufficient documentation

## 2015-04-01 DIAGNOSIS — F17210 Nicotine dependence, cigarettes, uncomplicated: Secondary | ICD-10-CM | POA: Insufficient documentation

## 2015-04-01 DIAGNOSIS — Z3A26 26 weeks gestation of pregnancy: Secondary | ICD-10-CM | POA: Insufficient documentation

## 2015-04-01 DIAGNOSIS — O99332 Smoking (tobacco) complicating pregnancy, second trimester: Secondary | ICD-10-CM | POA: Diagnosis not present

## 2015-04-01 LAB — URINALYSIS, ROUTINE W REFLEX MICROSCOPIC
Bilirubin Urine: NEGATIVE
Glucose, UA: NEGATIVE mg/dL
HGB URINE DIPSTICK: NEGATIVE
KETONES UR: NEGATIVE mg/dL
Leukocytes, UA: NEGATIVE
Nitrite: NEGATIVE
PH: 6.5 (ref 5.0–8.0)
PROTEIN: NEGATIVE mg/dL
Specific Gravity, Urine: 1.02 (ref 1.005–1.030)
Urobilinogen, UA: 0.2 mg/dL (ref 0.0–1.0)

## 2015-04-01 LAB — WET PREP, GENITAL
Clue Cells Wet Prep HPF POC: NONE SEEN
TRICH WET PREP: NONE SEEN
WBC WET PREP: NONE SEEN
YEAST WET PREP: NONE SEEN

## 2015-04-01 LAB — AMNISURE RUPTURE OF MEMBRANE (ROM) NOT AT ARMC: Amnisure ROM: NEGATIVE

## 2015-04-01 LAB — FETAL FIBRONECTIN: Fetal Fibronectin: POSITIVE — AB

## 2015-04-01 NOTE — Discharge Instructions (Signed)
Call the clinic or go to [facility] if:  You begin to have strong, frequent contractions  Your water breaks.  Sometimes it is a big gush of fluid, sometimes it is just a trickle that keeps getting your panties wet or running down your legs  You have vaginal bleeding.  It is normal to have a small amount of spotting if your cervix was checked.   You don't feel your baby moving like normal.  If you don't, get you something to eat and drink and lay down and focus on feeling your baby move.  You should feel at least 10 movements in 2 hours.  If you don't, you should call the office or go to [facility].    Return to [facility] hospital if your vaginal pressure worsens.   I have requested that the high risk clinic call you to schedule an appointment. If you don't hear from them, please call them later this week.

## 2015-04-01 NOTE — MAU Note (Addendum)
Been having pressure in lower abd.  Feels like needs to poop, has had several b.m.'s.  Thinks she lost the rest of her plug. No bleeding, has changed underwear twice, was wet

## 2015-04-01 NOTE — MAU Provider Note (Signed)
History     CSN: [id number]  Arrival date and time: 04/01/15 1703   None     Chief Complaint  Patient presents with  . Vaginal Discharge  . Abdominal Pain   HPI   Patient is 24 y.o. [id number] [redacted]w[redacted]d here with complaints of leaking of fluid. First noticed LOF at 10am this morning. Has had to change her pants twice since then due to fluid. Denies vaginal bleeding, but notes that she did pass her mucus plug.   Complains of constant pelvic pressure that began about 2 pm today.   +FM, denies VB, contractions, vaginal discharge.  Patient recently moved from [place] and is trying to establish care at the [facility]. Has hx of 20 wk IUFD and 12 wk spontaneous abortion.     OB History    Gravida Para Term Preterm AB TAB SAB Ectopic Multiple Living   5 1 1  0 3  3   1       Past Medical History  Diagnosis Date  . Bipolar 1 disorder   . Anemia   . Anxiety   . Depression   . GERD (gastroesophageal reflux disease)   . Headache     Past Surgical History  Procedure Laterality Date  . Tympanostomy tube placement      No family history on file.  History  Substance Use Topics  . Smoking status: Current Every Day Smoker -- 0.00 packs/day    Types: Cigarettes  . Smokeless tobacco: Never Used  . Alcohol Use: No    Allergies: No Known Allergies  Prescriptions prior to admission  Medication Sig Dispense Refill Last Dose  . Prenatal Vit-Fe Fumarate-FA (PRENATAL FORMULA PO) Take 2 tablets by mouth at bedtime.    Past Week at Unknown time    Review of Systems  Constitutional: Negative for fever, chills and malaise/fatigue.  HENT: Negative for congestion.   Eyes: Negative for blurred vision and double vision.  Respiratory: Negative for cough and shortness of breath.   Cardiovascular: Negative for chest pain, palpitations, claudication and leg swelling.  Gastrointestinal: Negative for heartburn, nausea, vomiting, abdominal pain, diarrhea and constipation.  Genitourinary: Negative for  dysuria and hematuria.  Musculoskeletal: Negative for myalgias and back pain.  Skin: Negative for itching and rash.  Neurological: Negative for dizziness, loss of consciousness and headaches.   Physical Exam   Blood pressure 105/64, pulse 98, temperature 98.1 F (36.7 C), temperature source Oral, resp. rate 18, height 5\' 7"  (1.702 m), weight 76.204 kg (168 lb), last menstrual period 10/07/2014.  Physical Exam  Constitutional: She is oriented to person, place, and time. She appears well-developed and well-nourished. No distress.  HENT:  Head: Normocephalic and atraumatic.  Eyes: Conjunctivae and EOM are normal.  Neck: Normal range of motion. No thyromegaly present.  Cardiovascular: Normal rate, regular rhythm and normal heart sounds.  Exam reveals no gallop and no friction rub.   No murmur heard. Respiratory: Breath sounds normal. No respiratory distress. She has no wheezes. She has no rales.  GI: Soft. Bowel sounds are normal. She exhibits no distension. There is no tenderness.  Genitourinary: Vagina normal.  Musculoskeletal: Normal range of motion. She exhibits no edema.  Neurological: She is alert and oriented to person, place, and time.  Skin: Skin is warm and dry. No rash noted. No erythema.  Psychiatric: She has a normal mood and affect. Her behavior is normal.    SSE: No pooling of fluid in posterior fornix. No fluid with coughing. Cervical os visually closed.  Dilation: Closed Effacement (%): Thick Presentation: Undeterminable Exam by:: Dr Earlene Plater   FHR: baseline 145, good variability, + accels, no decels Toco: no UC  MAU Course  Procedures  MDM SSE performed. Cervical exam performed. Wet prep and amnisure collected.   Amnisure and wet prep were negative.  Patient declined pain medicine for pelvic pressure.   Assessment and Plan  Patient is 24 y.o. [phone] [redacted]w[redacted]d reporting LOF and pelvic pressure. - fetal kick counts reinforced - preterm labor precautions given  and told to return if pelvic pressure increases in intensity  -encouraged pt to rest and drink plenty of fluids  -due to negative amnisure, reassuring SSE and cervical exam, determined that pt did not have ROM and is most likely not in preterm labor  -have sent message to [facility] asking to schedule appointment for pt to f/u regarding pelvic pressure  -stable for discharge home    De Hollingshead 04/01/2015, 6:50 PM   CNM attestation:  I have seen and examined this patient; I agree with above documentation in the resident's note.   Rori Goar is a 24 y.o. [phone] reporting LOF and pelvic pressure with hx of IUFD and 12 week SAB. +FM, denies VB, contractions, vaginal discharge.  PE: BP 107/58 mmHg  Pulse 74  Temp(Src) 98.1 F (36.7 C) (Oral)  Resp 18  Ht  (1.702 m)  Wt 168 lb (76.204 kg)  BMI 26.31 kg/m2  LMP 10/07/2014 Gen: calm comfortable, NAD Resp: normal effort, no distress Abd: gravid  ROS, labs, PMH reviewed NST reactive   Cervix closed, amnisure negative  Plan: D/C home Fetal kick counts reinforced, PTL precautions Set up care in OB clinic Return to MAU as needed for emergencies  LEFTWICH-KIRBY, Rajvir Ernster, CNM 8:20 AM

## 2015-04-04 ENCOUNTER — Encounter: Payer: Self-pay | Admitting: *Deleted

## 2015-04-07 ENCOUNTER — Ambulatory Visit (INDEPENDENT_AMBULATORY_CARE_PROVIDER_SITE_OTHER): Payer: Self-pay | Admitting: Obstetrics and Gynecology

## 2015-04-07 ENCOUNTER — Encounter: Payer: Self-pay | Admitting: Obstetrics and Gynecology

## 2015-04-07 VITALS — BP 111/63 | HR 89 | Temp 97.9°F | Wt 182.2 lb

## 2015-04-07 DIAGNOSIS — F3131 Bipolar disorder, current episode depressed, mild: Secondary | ICD-10-CM

## 2015-04-07 DIAGNOSIS — F319 Bipolar disorder, unspecified: Secondary | ICD-10-CM

## 2015-04-07 DIAGNOSIS — Z23 Encounter for immunization: Secondary | ICD-10-CM

## 2015-04-07 DIAGNOSIS — Z8759 Personal history of other complications of pregnancy, childbirth and the puerperium: Secondary | ICD-10-CM

## 2015-04-07 DIAGNOSIS — O09291 Supervision of pregnancy with other poor reproductive or obstetric history, first trimester: Secondary | ICD-10-CM

## 2015-04-07 DIAGNOSIS — O0993 Supervision of high risk pregnancy, unspecified, third trimester: Secondary | ICD-10-CM

## 2015-04-07 DIAGNOSIS — Z3493 Encounter for supervision of normal pregnancy, unspecified, third trimester: Secondary | ICD-10-CM

## 2015-04-07 DIAGNOSIS — Z3490 Encounter for supervision of normal pregnancy, unspecified, unspecified trimester: Secondary | ICD-10-CM

## 2015-04-07 DIAGNOSIS — O09293 Supervision of pregnancy with other poor reproductive or obstetric history, third trimester: Secondary | ICD-10-CM | POA: Diagnosis not present

## 2015-04-07 DIAGNOSIS — O99333 Smoking (tobacco) complicating pregnancy, third trimester: Secondary | ICD-10-CM | POA: Diagnosis not present

## 2015-04-07 DIAGNOSIS — O99343 Other mental disorders complicating pregnancy, third trimester: Secondary | ICD-10-CM | POA: Diagnosis not present

## 2015-04-07 LAB — CBC
HEMATOCRIT: 35.6 % — AB (ref 36.0–46.0)
HEMOGLOBIN: 11.9 g/dL — AB (ref 12.0–15.0)
MCH: 29.9 pg (ref 26.0–34.0)
MCHC: 33.4 g/dL (ref 30.0–36.0)
MCV: 89.4 fL (ref 78.0–100.0)
MPV: 9.7 fL (ref 8.6–12.4)
Platelets: 276 10*3/uL (ref 150–400)
RBC: 3.98 MIL/uL (ref 3.87–5.11)
RDW: 13 % (ref 11.5–15.5)
WBC: 16.1 10*3/uL — ABNORMAL HIGH (ref 4.0–10.5)

## 2015-04-07 LAB — POCT URINALYSIS DIP (DEVICE)
Bilirubin Urine: NEGATIVE
Glucose, UA: NEGATIVE mg/dL
Hgb urine dipstick: NEGATIVE
Ketones, ur: NEGATIVE mg/dL
Leukocytes, UA: NEGATIVE
NITRITE: NEGATIVE
PH: 7 (ref 5.0–8.0)
PROTEIN: NEGATIVE mg/dL
Specific Gravity, Urine: 1.025 (ref 1.005–1.030)
Urobilinogen, UA: 0.2 mg/dL (ref 0.0–1.0)

## 2015-04-07 MED ORDER — TETANUS-DIPHTH-ACELL PERTUSSIS 5-2.5-18.5 LF-MCG/0.5 IM SUSP
0.5000 mL | Freq: Once | INTRAMUSCULAR | Status: AC
  Administered 2015-04-07: 0.5 mL via INTRAMUSCULAR

## 2015-04-07 NOTE — Progress Notes (Signed)
Ultrasound scheduled for 04/10/2015 :30 AM

## 2015-04-07 NOTE — Progress Notes (Signed)
Nutrition Note: 1st visit.   Pt reports some nausea.  Eating 3 meals and 2-3 snacks/d.  PNV daily. Receives [facility].  Plans to breastfeed.   Wt gain > expected.   Discussed importance of appropriate wt gain. Verbal and written education on tips for nausea and general nutrition during pregnancy.   F/U as needed.   Candice C. Earlene Plater, MPH, RD, LDN

## 2015-04-07 NOTE — Progress Notes (Signed)
Reports intense pelvic pressure and feels like she is having labor type contractions; per chart review FFN +

## 2015-04-07 NOTE — Progress Notes (Signed)
Subjective:  Yolanda Baird is a 24 y.o. [id number] at [redacted]w[redacted]d being seen today for initial prenatal care.  Patient reports no complaints.  Contractions: Irritability.  Vag. Bleeding: None. Movement: Present. Denies leaking of fluid.   The following portions of the patient's history were reviewed and updated as appropriate: allergies, current medications, past family history, past medical history, past social history, past surgical history and problem list.   Objective:   Filed Vitals:   04/07/15 0811  BP: 111/63  Pulse: 89  Temp: 97.9 F (36.6 C)  Weight: 182 lb 3.2 oz (82.645 kg)    Fetal Status: Fetal Heart Rate (bpm): 145 Fundal Height: 26 cm Movement: Present     General:  Alert, oriented and cooperative. Patient is in no acute distress.  Skin: Skin is warm and dry. No rash noted.   Cardiovascular: Normal heart rate noted  Respiratory: Normal respiratory effort, no problems with respiration noted  Abdomen: Soft, gravid, appropriate for gestational age. Pain/Pressure: Present     Pelvic: Vag. Bleeding: None Vag D/C Character: White   Cervical exam deferred        Extremities: Normal range of motion.  Edema: None  Mental Status: Normal mood and affect. Normal behavior. Normal judgment and thought content.   Urinalysis: Urine Protein: Negative Urine Glucose: Negative  Assessment and Plan:  Pregnancy: [id number] at [redacted]w[redacted]d  1. Supervision of high risk pregnancy in third trimester - previous records reviewed - glucola today - says had anatomy scan but those records requested > 1 month ago and haven't arrived, so repeating u/s order today  # bipolar disorder - not currently on meds, says not symptomatic - phq9 score of 8, will continue to monitor  Preterm labor symptoms and general obstetric precautions including but not limited to vaginal bleeding, contractions, leaking of fluid and fetal movement were reviewed in detail with the patient. Please refer to After Visit Summary for other  counseling recommendations.  Return in about 2 weeks (around 04/21/2015).   Kathrynn Running, MD

## 2015-04-08 ENCOUNTER — Other Ambulatory Visit: Payer: Self-pay | Admitting: General Practice

## 2015-04-08 DIAGNOSIS — O0993 Supervision of high risk pregnancy, unspecified, third trimester: Secondary | ICD-10-CM

## 2015-04-08 DIAGNOSIS — Z3A27 27 weeks gestation of pregnancy: Secondary | ICD-10-CM

## 2015-04-08 LAB — GLUCOSE TOLERANCE, 1 HOUR (50G) W/O FASTING: Glucose, 1 Hour GTT: 109 mg/dL (ref 70–140)

## 2015-04-08 LAB — CULTURE, OB URINE: Colony Count: 15000

## 2015-04-08 LAB — HIV ANTIBODY (ROUTINE TESTING W REFLEX): HIV: NONREACTIVE

## 2015-04-08 LAB — RPR

## 2015-04-09 LAB — PRESCRIPTION MONITORING PROFILE (19 PANEL)
AMPHETAMINE/METH: NEGATIVE ng/mL
BENZODIAZEPINE SCREEN, URINE: NEGATIVE ng/mL
BUPRENORPHINE, URINE: NEGATIVE ng/mL
Barbiturate Screen, Urine: NEGATIVE ng/mL
CANNABINOID SCRN UR: NEGATIVE ng/mL
Carisoprodol, Urine: NEGATIVE ng/mL
Cocaine Metabolites: NEGATIVE ng/mL
Creatinine, Urine: 105.55 mg/dL (ref >20.0)
Fentanyl, Ur: NEGATIVE ng/mL
MDMA URINE: NEGATIVE ng/mL
Meperidine, Ur: NEGATIVE ng/mL
Methadone Screen, Urine: NEGATIVE ng/mL
Methaqualone: NEGATIVE ng/mL
Nitrites, Initial: NEGATIVE ug/mL
OPIATE SCREEN, URINE: NEGATIVE ng/mL
OXYCODONE SCRN UR: NEGATIVE ng/mL
PROPOXYPHENE: NEGATIVE ng/mL
Phencyclidine, Ur: NEGATIVE ng/mL
TAPENTADOLUR: NEGATIVE ng/mL
Tramadol Scrn, Ur: NEGATIVE ng/mL
ZOLPIDEM, URINE: NEGATIVE ng/mL
pH, Initial: 7.3 pH (ref 4.5–8.9)

## 2015-04-10 ENCOUNTER — Ambulatory Visit (HOSPITAL_COMMUNITY)
Admission: RE | Admit: 2015-04-10 | Discharge: 2015-04-10 | Disposition: A | Discharge status: Home / Self Care | Payer: Medicaid Other | Source: Ambulatory Visit | Attending: Obstetrics and Gynecology | Admitting: Obstetrics and Gynecology

## 2015-04-10 ENCOUNTER — Other Ambulatory Visit (HOSPITAL_COMMUNITY): Payer: Self-pay | Admitting: *Deleted

## 2015-04-10 ENCOUNTER — Other Ambulatory Visit: Payer: Self-pay | Admitting: General Practice

## 2015-04-10 DIAGNOSIS — O0932 Supervision of pregnancy with insufficient antenatal care, second trimester: Secondary | ICD-10-CM

## 2015-04-10 DIAGNOSIS — O4702 False labor before 37 completed weeks of gestation, second trimester: Secondary | ICD-10-CM

## 2015-04-10 DIAGNOSIS — O09292 Supervision of pregnancy with other poor reproductive or obstetric history, second trimester: Secondary | ICD-10-CM | POA: Diagnosis not present

## 2015-04-10 DIAGNOSIS — Z3A27 27 weeks gestation of pregnancy: Secondary | ICD-10-CM | POA: Diagnosis not present

## 2015-04-10 DIAGNOSIS — O0993 Supervision of high risk pregnancy, unspecified, third trimester: Secondary | ICD-10-CM

## 2015-04-10 DIAGNOSIS — F17210 Nicotine dependence, cigarettes, uncomplicated: Secondary | ICD-10-CM

## 2015-04-10 DIAGNOSIS — O99332 Smoking (tobacco) complicating pregnancy, second trimester: Secondary | ICD-10-CM | POA: Insufficient documentation

## 2015-04-12 ENCOUNTER — Inpatient Hospital Stay (HOSPITAL_COMMUNITY)
Admission: AD | Admit: 2015-04-12 | Discharge: 2015-04-12 | Disposition: A | Discharge status: Home / Self Care | Payer: Medicaid Other | Source: Ambulatory Visit | Attending: Obstetrics & Gynecology | Admitting: Obstetrics & Gynecology

## 2015-04-12 ENCOUNTER — Encounter (HOSPITAL_COMMUNITY): Payer: Self-pay

## 2015-04-12 DIAGNOSIS — Z3A28 28 weeks gestation of pregnancy: Secondary | ICD-10-CM | POA: Diagnosis not present

## 2015-04-12 DIAGNOSIS — R102 Pelvic and perineal pain: Secondary | ICD-10-CM | POA: Insufficient documentation

## 2015-04-12 DIAGNOSIS — R198 Other specified symptoms and signs involving the digestive system and abdomen: Secondary | ICD-10-CM | POA: Diagnosis not present

## 2015-04-12 DIAGNOSIS — O9989 Other specified diseases and conditions complicating pregnancy, childbirth and the puerperium: Secondary | ICD-10-CM | POA: Insufficient documentation

## 2015-04-12 LAB — URINALYSIS, ROUTINE W REFLEX MICROSCOPIC
Bilirubin Urine: NEGATIVE
GLUCOSE, UA: NEGATIVE mg/dL
HGB URINE DIPSTICK: NEGATIVE
Ketones, ur: NEGATIVE mg/dL
Leukocytes, UA: NEGATIVE
Nitrite: NEGATIVE
PH: 7.5 (ref 5.0–8.0)
Protein, ur: NEGATIVE mg/dL
SPECIFIC GRAVITY, URINE: 1.02 (ref 1.005–1.030)
Urobilinogen, UA: 0.2 mg/dL (ref 0.0–1.0)

## 2015-04-12 NOTE — MAU Provider Note (Signed)
  History     CSN: [id number]  Arrival date and time: 04/12/15 0120   None     Chief Complaint  Patient presents with  . Pelvic Pain   HPI [name] is a 24yo [id number] @ 28.0wks by 9wk U/S who presents for eval of rectal pressure. Reports rare ctx, no leaking or bldg, but +mucousy discharge. States she has been having the rectal pressure ("feels like I have to poop") on a regular basis, but denies constipation. Her prenatal care was started this week at the [facility] and has been remarkable for 1) 21wk demise 2) bipolar 3) +fFN 11d ago.  PN records rev'd.  OB History    Gravida Para Term Preterm AB TAB SAB Ectopic Multiple Living   0 2 0 0 1      Past Medical History  Diagnosis Date  . Bipolar 1 disorder   . Anemia   . Anxiety   . Depression   . GERD (gastroesophageal reflux disease)   . Headache   . Bipolar disorder     Past Surgical History  Procedure Laterality Date  . Tympanostomy tube placement      Family History  Problem Relation Age of Onset  . Diabetes Maternal Grandmother     Social History  Substance Use Topics  . Smoking status: Current Every Day Smoker -- 0.00 packs/day    Types: Cigarettes  . Smokeless tobacco: Never Used  . Alcohol Use: No    Allergies: No Known Allergies  Prescriptions prior to admission  Medication Sig Dispense Refill Last Dose  . Prenatal Vit-Fe Fumarate-FA (PRENATAL FORMULA PO) Take 2 tablets by mouth at bedtime.    Past Week at Unknown time    ROS  No other pertinents other than what is listed in HPI Physical Exam   Blood pressure 124/70, pulse 117, temperature 98.3 F (36.8 C), resp. rate 18, height  (1.702 m), weight 82.464 kg (181 lb 12.8 oz), last menstrual period 10/07/2014.  Physical Exam  Constitutional: She appears well-developed.  HENT:  Head: Normocephalic.  Neck: Normal range of motion.  Cardiovascular:  Sl tachy @ 117  Respiratory: Effort normal.  GI:  EFM 140-150s, +accels, no decels No ctx  per toco  Genitourinary:  Cx C/L  Musculoskeletal: Normal range of motion.  Neurological: She is alert.  Skin: Skin is warm and dry.  Psychiatric: She has a normal mood and affect. Her behavior is normal. Thought content normal.   Urinalysis    Component Value Date/Time   COLORURINE YELLOW 04/12/2015 0140   APPEARANCEUR CLEAR 04/12/2015 0140   LABSPEC 1.020 04/12/2015 0140   PHURINE 7.5 04/12/2015 0140   GLUCOSEU NEGATIVE 04/12/2015 0140   HGBUR NEGATIVE 04/12/2015 0140   BILIRUBINUR NEGATIVE 04/12/2015 0140   KETONESUR NEGATIVE 04/12/2015 0140   PROTEINUR NEGATIVE 04/12/2015 0140   UROBILINOGEN 0.2 04/12/2015 0140   NITRITE NEGATIVE 04/12/2015 0140   LEUKOCYTESUR NEGATIVE 04/12/2015 0140     MAU Course  Procedures  MDM UA ordered Cx exam NST read  Assessment and Plan  IUP@28 .0wks Rectal pressure  D/C home w/ PTL precautions F/U as scheduled at [facility] CNM 04/12/2015, 2:09 AM

## 2015-04-12 NOTE — MAU Note (Signed)
Woke up about 1400 and had mucousy d/c. Some lower back pain and feel like I have to have BM but i don't. Have had diarrhea yesterday. Vomited once after drank a lot of juice but have been vomiting the entire pregnancy. (Had PIC line early preg.)

## 2015-04-12 NOTE — Discharge Instructions (Signed)

## 2015-04-21 ENCOUNTER — Ambulatory Visit (INDEPENDENT_AMBULATORY_CARE_PROVIDER_SITE_OTHER): Payer: Medicaid Other | Admitting: Obstetrics and Gynecology

## 2015-04-21 VITALS — BP 109/66 | HR 90 | Temp 98.4°F | Wt 187.7 lb

## 2015-04-21 DIAGNOSIS — O09293 Supervision of pregnancy with other poor reproductive or obstetric history, third trimester: Secondary | ICD-10-CM | POA: Diagnosis not present

## 2015-04-21 DIAGNOSIS — O99333 Smoking (tobacco) complicating pregnancy, third trimester: Secondary | ICD-10-CM

## 2015-04-21 DIAGNOSIS — Z3490 Encounter for supervision of normal pregnancy, unspecified, unspecified trimester: Secondary | ICD-10-CM

## 2015-04-21 DIAGNOSIS — Z23 Encounter for immunization: Secondary | ICD-10-CM

## 2015-04-21 DIAGNOSIS — F319 Bipolar disorder, unspecified: Secondary | ICD-10-CM

## 2015-04-21 DIAGNOSIS — Z3493 Encounter for supervision of normal pregnancy, unspecified, third trimester: Secondary | ICD-10-CM | POA: Diagnosis present

## 2015-04-21 DIAGNOSIS — O99343 Other mental disorders complicating pregnancy, third trimester: Secondary | ICD-10-CM | POA: Diagnosis not present

## 2015-04-21 DIAGNOSIS — O0993 Supervision of high risk pregnancy, unspecified, third trimester: Secondary | ICD-10-CM

## 2015-04-21 DIAGNOSIS — O99330 Smoking (tobacco) complicating pregnancy, unspecified trimester: Secondary | ICD-10-CM | POA: Insufficient documentation

## 2015-04-21 LAB — POCT URINALYSIS DIP (DEVICE)
BILIRUBIN URINE: NEGATIVE
Glucose, UA: NEGATIVE mg/dL
HGB URINE DIPSTICK: NEGATIVE
Ketones, ur: NEGATIVE mg/dL
Leukocytes, UA: NEGATIVE
Nitrite: NEGATIVE
PH: 6 (ref 5.0–8.0)
PROTEIN: NEGATIVE mg/dL
Urobilinogen, UA: 0.2 mg/dL (ref 0.0–1.0)

## 2015-04-21 NOTE — Progress Notes (Signed)
Subjective:  Yolanda Baird is a 24 y.o. [id number] at [redacted]w[redacted]d being seen today for ongoing prenatal care.  Patient reports intermittent contractions every 15 minutes or so. No bleeding or leakage of fluid. No dysuria. Contractions: Irritability.  Vag. Bleeding: None. Movement: Present. Denies leaking of fluid.  Still smoking.   The following portions of the patient's history were reviewed and updated as appropriate: allergies, current medications, past family history, past medical history, past social history, past surgical history and problem list.   Objective:   Filed Vitals:   04/21/15 0933  BP: 109/66  Pulse: 90  Temp: 98.4 F (36.9 C)  Weight: 187 lb 11.2 oz (85.14 kg)    Fetal Status: Fetal Heart Rate (bpm): 156 Fundal Height: 30 cm Movement: Present     General:  Alert, oriented and cooperative. Patient is in no acute distress.  Skin: Skin is warm and dry. No rash noted.   Cardiovascular: Normal heart rate noted  Respiratory: Normal respiratory effort, no problems with respiration noted  Abdomen: Soft, gravid, appropriate for gestational age. Pain/Pressure: Present     Pelvic: Vag. Bleeding: None     Cervix closed/thick/high  Extremities: Normal range of motion.  Edema: None  Mental Status: Normal mood and affect. Normal behavior. Normal judgment and thought content.   Urinalysis:      Assessment and Plan:  Pregnancy: [id number] at [redacted]w[redacted]d  1. Supervision of normal pregnancy, unspecified trimester - likely braxton hicks contractions, cervix closed, education provided - continue pnv  2. Flu vaccine need - Flu Vaccine QUAD 36+ mos IM; Standing - Flu Vaccine QUAD 36+ mos IM  3. Tobacco abuse - discussed, declines nrt  Preterm labor symptoms and general obstetric precautions including but not limited to vaginal bleeding, contractions, leaking of fluid and fetal movement were reviewed in detail with the patient. Please refer to After Visit Summary for other counseling  recommendations.  Return in about 2 weeks (around 05/05/2015).   Kathrynn Running, MD

## 2015-04-21 NOTE — Progress Notes (Signed)
Breastfeeding tip of the week reviewed Flu vaccine  

## 2015-05-06 ENCOUNTER — Telehealth: Payer: Self-pay

## 2015-05-06 NOTE — Telephone Encounter (Signed)
Pt called and stated that she is still spotting but is having little contractions.  Could someone please call her?

## 2015-05-07 NOTE — Telephone Encounter (Signed)
Called patient stating I am returning your phone call. Patient states she has been having contractions every 10 minutes and she has had some spotting. Patient states she stuck a finger into her vagina to see if the blood was coming from her vagina or from her bladder. Patient states she could feel the babies head up there and her cervix opened. Patient states with her last baby she didn't even know she was in labor. Told patient if she is concerned that she is having consistent contractions she should go to MAU for evaluation. Patient states she has an ultrasound appt tomorrow and she was going to go after that. Told patient we wouldn't advise she wait but would recommend she come in. Patient verbalized understanding and had no other questions

## 2015-05-08 ENCOUNTER — Ambulatory Visit (HOSPITAL_COMMUNITY)
Admission: RE | Admit: 2015-05-08 | Discharge: 2015-05-08 | Disposition: A | Discharge status: Home / Self Care | Payer: Medicaid Other | Source: Ambulatory Visit | Attending: Obstetrics and Gynecology | Admitting: Obstetrics and Gynecology

## 2015-05-08 ENCOUNTER — Encounter (HOSPITAL_COMMUNITY): Payer: Self-pay

## 2015-05-08 ENCOUNTER — Encounter (HOSPITAL_COMMUNITY): Payer: Self-pay | Admitting: *Deleted

## 2015-05-08 ENCOUNTER — Inpatient Hospital Stay (HOSPITAL_COMMUNITY)
Admission: AD | Admit: 2015-05-08 | Discharge: 2015-05-08 | Discharge status: Against Medical Advice | Payer: Medicaid Other | Source: Ambulatory Visit | Attending: Family Medicine | Admitting: Family Medicine

## 2015-05-08 ENCOUNTER — Ambulatory Visit: Payer: Medicaid Other

## 2015-05-08 ENCOUNTER — Other Ambulatory Visit (HOSPITAL_COMMUNITY): Payer: Self-pay | Admitting: Maternal and Fetal Medicine

## 2015-05-08 VITALS — BP 110/71 | HR 99 | Wt 196.2 lb

## 2015-05-08 DIAGNOSIS — O4702 False labor before 37 completed weeks of gestation, second trimester: Secondary | ICD-10-CM

## 2015-05-08 DIAGNOSIS — O4693 Antepartum hemorrhage, unspecified, third trimester: Secondary | ICD-10-CM

## 2015-05-08 DIAGNOSIS — O09293 Supervision of pregnancy with other poor reproductive or obstetric history, third trimester: Secondary | ICD-10-CM

## 2015-05-08 DIAGNOSIS — O0932 Supervision of pregnancy with insufficient antenatal care, second trimester: Secondary | ICD-10-CM | POA: Diagnosis not present

## 2015-05-08 DIAGNOSIS — Z8759 Personal history of other complications of pregnancy, childbirth and the puerperium: Secondary | ICD-10-CM

## 2015-05-08 DIAGNOSIS — F17210 Nicotine dependence, cigarettes, uncomplicated: Secondary | ICD-10-CM | POA: Insufficient documentation

## 2015-05-08 DIAGNOSIS — O99330 Smoking (tobacco) complicating pregnancy, unspecified trimester: Secondary | ICD-10-CM | POA: Insufficient documentation

## 2015-05-08 DIAGNOSIS — O99333 Smoking (tobacco) complicating pregnancy, third trimester: Secondary | ICD-10-CM | POA: Insufficient documentation

## 2015-05-08 DIAGNOSIS — Z3A00 Weeks of gestation of pregnancy not specified: Secondary | ICD-10-CM | POA: Insufficient documentation

## 2015-05-08 DIAGNOSIS — Z3A31 31 weeks gestation of pregnancy: Secondary | ICD-10-CM | POA: Diagnosis not present

## 2015-05-08 DIAGNOSIS — O4700 False labor before 37 completed weeks of gestation, unspecified trimester: Secondary | ICD-10-CM

## 2015-05-08 LAB — RAPID URINE DRUG SCREEN, HOSP PERFORMED
Amphetamines: NOT DETECTED
BARBITURATES: NOT DETECTED
Benzodiazepines: NOT DETECTED
Cocaine: NOT DETECTED
Opiates: NOT DETECTED
Tetrahydrocannabinol: POSITIVE — AB

## 2015-05-08 LAB — URINALYSIS, ROUTINE W REFLEX MICROSCOPIC
Bilirubin Urine: NEGATIVE
GLUCOSE, UA: NEGATIVE mg/dL
Hgb urine dipstick: NEGATIVE
Ketones, ur: NEGATIVE mg/dL
Leukocytes, UA: NEGATIVE
Nitrite: NEGATIVE
Protein, ur: NEGATIVE mg/dL
SPECIFIC GRAVITY, URINE: 1.02 (ref 1.005–1.030)
Urobilinogen, UA: 0.2 mg/dL (ref 0.0–1.0)
pH: 6.5 (ref 5.0–8.0)

## 2015-05-08 LAB — WET PREP, GENITAL
CLUE CELLS WET PREP: NONE SEEN
TRICH WET PREP: NONE SEEN
YEAST WET PREP: NONE SEEN

## 2015-05-08 MED ORDER — BETAMETHASONE SOD PHOS & ACET 6 (3-3) MG/ML IJ SUSP
12.0000 mg | Freq: Once | INTRAMUSCULAR | Status: DC
  Filled 2015-05-08: qty 2

## 2015-05-08 NOTE — MAU Note (Addendum)
Pt removed herself off of EFM, called out to nurses station requesting to leave.  RN at bedside to discuss necessity of continuing EFM for another hour due to cervical dilation.  Pt tearful and states she doesn't have a ride home unless she leaves now.  Dr. Redmond Baseman notified.  MD on way to discuss results and plan.  1345 Pt rechecked, Dr. Kermit Balo explained all risks and reasons to return to MAU.  Pt verbalized understanding and repeated back to Dr. Alvester Morin the reasons to return.  Pt Signed out AMA.  Pt planning to return to high risk clinic in the AM for first dose of BMZ.

## 2015-05-08 NOTE — Discharge Instructions (Signed)
Make sure you drink lots of water to help reduce your contractions. Return to the hospital for: 1) more intense contractions 2) more frequent contractions 3) Loss of fluid like your broke your water 4) Continued bleeding 5) changes in your baby's movements (less than normal)    Preterm Labor Information Preterm labor is when labor starts at less than 37 weeks of pregnancy. The normal length of a pregnancy is 39 to 41 weeks. CAUSES Often, there is no identifiable underlying cause as to why a woman goes into preterm labor. One of the most common known causes of preterm labor is infection. Infections of the uterus, cervix, vagina, amniotic sac, bladder, kidney, or even the lungs (pneumonia) can cause labor to start. Other suspected causes of preterm labor include:   Urogenital infections, such as yeast infections and bacterial vaginosis.   Uterine abnormalities (uterine shape, uterine septum, fibroids, or bleeding from the placenta).   A cervix that has been operated on (it may fail to stay closed).   Malformations in the fetus.   Multiple gestations (twins, triplets, and so on).   Breakage of the amniotic sac.  RISK FACTORS  Having a previous history of preterm labor.   Having premature rupture of membranes (PROM).   Having a placenta that covers the opening of the cervix (placenta previa).   Having a placenta that separates from the uterus (placental abruption).   Having a cervix that is too weak to hold the fetus in the uterus (incompetent cervix).   Having too much fluid in the amniotic sac (polyhydramnios).   Taking illegal drugs or smoking while pregnant.   Not gaining enough weight while pregnant.   Being younger than 49 and older than 23 years old.   Having a low socioeconomic status.   Being African American. SYMPTOMS Signs and symptoms of preterm labor include:   Menstrual-like cramps, abdominal pain, or back pain.  Uterine contractions that  are regular, as frequent as six in an hour, regardless of their intensity (may be mild or painful).  Contractions that start on the top of the uterus and spread down to the lower abdomen and back.   A sense of increased pelvic pressure.   A watery or bloody mucus discharge that comes from the vagina.  TREATMENT Depending on the length of the pregnancy and other circumstances, your health care provider may suggest bed rest. If necessary, there are medicines that can be given to stop contractions and to mature the fetal lungs. If labor happens before 34 weeks of pregnancy, a prolonged hospital stay may be recommended. Treatment depends on the condition of both you and the fetus.  WHAT SHOULD YOU DO IF YOU THINK YOU ARE IN PRETERM LABOR? Call your health care provider right away. You will need to go to the hospital to get checked immediately. HOW CAN YOU PREVENT PRETERM LABOR IN FUTURE PREGNANCIES? You should:   Stop smoking if you smoke.  Maintain healthy weight gain and avoid chemicals and drugs that are not necessary. Be watchful for any type of infection.  Inform your health care provider if you have a known history of preterm labor. Document Released: 11/06/2003 Document Revised: 04/18/2013 Document Reviewed: 09/18/2012 [facility] Patient Information 2015 [place], [place]. This information is not intended to replace advice given to you by your health care provider. Make sure you discuss any questions you have with your health care provider.

## 2015-05-08 NOTE — ED Notes (Signed)
Pt states she has been having contractions every 10 mins accompanied by mucousy bleeding. States she wanted to know where bleeding was coming from so she put her "fingers in there".  States she thinks her cervix is open and felt the baby's head.  Pt states she called her OB office yesterday and was instructed to come get checked out but didn't go because she knew she had appointment here and didn't "want to be stuck down there for 8 hours".  Instructed patient that we cannot rule out labor in our department and that after her [place] here she should go to MAU to have her bleeding and contractions assessed.

## 2015-05-08 NOTE — MAU Note (Addendum)
Pt reports having ctx on and off since Sunday night. "checked herself last night and though she may be opening. Stated she was having some vaginal bleeding as well. Was instructed to come to MAU last night but did not because she did not want to wait. Had MFM appointment to day for u/s growth and was sent here after.

## 2015-05-08 NOTE — MAU Provider Note (Deleted)
MAU HISTORY AND PHYSICAL  Chief Complaint:  Contractions   Yolanda Baird is a 24 y.o.  [id number] with IUP at [redacted]w[redacted]d presenting for Contractions . Patient states she has been having  irregular, every 0-15 minutes contractions, no vaginal bleeding but did have pink spotting in cervical mucous today, intact membranes, with active fetal movement.  Patient started feeling contractions for the past few days, earlier this AM having contractions q84min then increased in frequency to q31min. Denies any LOF.  Menstrual History: OB History    Gravida Para Term Preterm AB TAB SAB Ectopic Multiple Living   0 2 0 0 1      Patient's last menstrual period was 10/07/2014.      Past Medical History  Diagnosis Date  . Bipolar 1 disorder   . Anemia   . Anxiety   . Depression   . GERD (gastroesophageal reflux disease)   . Headache   . Bipolar disorder     Past Surgical History  Procedure Laterality Date  . Tympanostomy tube placement      Family History  Problem Relation Age of Onset  . Diabetes Maternal Grandmother     Social History  Substance Use Topics  . Smoking status: Current Every Day Smoker -- 0.00 packs/day    Types: Cigarettes  . Smokeless tobacco: Never Used  . Alcohol Use: No     No Known Allergies  Prescriptions prior to admission  Medication Sig Dispense Refill Last Dose  . Prenatal Vit-Fe Fumarate-FA (PRENATAL FORMULA PO) Take 2 tablets by mouth at bedtime.    05/07/2015 at Unknown time    Review of Systems - Negative except for what is mentioned in HPI.  Physical Exam  Blood pressure 123/66, pulse 82, temperature 98.3 F (36.8 C), temperature source Oral, resp. rate 18, height  (1.702 m), last menstrual period 10/07/2014. GENERAL: Well-developed, well-nourished female in no acute distress.  LUNGS: Clear to auscultation bilaterally.  HEART: Regular rate and rhythm. ABDOMEN: Soft, nontender, nondistended, gravid.  EXTREMITIES: Nontender, no edema, 2+  distal pulses. Cervical Exam: Dilatation 1.5cm   Effacement 50%   Station -2   Presentation: cephalic FHT:  Baseline rate 145 bpm   Variability moderate  Accelerations present   Decelerations none Contractions: occasional, no consistent pattern Labs: Results for orders placed or performed during the hospital encounter of 05/08/15 (from the past 24 hour(s))  Urinalysis, Routine w reflex microscopic (not at [facility])   Collection Time: 05/08/15 10:20 AM  Result Value Ref Range   Color, Urine YELLOW YELLOW   APPearance CLEAR CLEAR   Specific Gravity, Urine 1.020 1.005 - 1.030   pH 6.5 5.0 - 8.0   Glucose, UA NEGATIVE NEGATIVE mg/dL   Hgb urine dipstick NEGATIVE NEGATIVE   Bilirubin Urine NEGATIVE NEGATIVE   Ketones, ur NEGATIVE NEGATIVE mg/dL   Protein, ur NEGATIVE NEGATIVE mg/dL   Urobilinogen, UA 0.2 0.0 - 1.0 mg/dL   Nitrite NEGATIVE NEGATIVE   Leukocytes, UA NEGATIVE NEGATIVE  Urine rapid drug screen (hosp performed)   Collection Time: 05/08/15 10:20 AM  Result Value Ref Range   Opiates NONE DETECTED NONE DETECTED   Cocaine NONE DETECTED NONE DETECTED   Benzodiazepines NONE DETECTED NONE DETECTED   Amphetamines NONE DETECTED NONE DETECTED   Tetrahydrocannabinol POSITIVE (A) NONE DETECTED   Barbiturates NONE DETECTED NONE DETECTED  Wet prep, genital   Collection Time: 05/08/15 12:05 PM  Result Value Ref Range   Yeast Wet Prep HPF POC NONE  SEEN NONE SEEN   Trich, Wet Prep NONE SEEN NONE SEEN   Clue Cells Wet Prep HPF POC NONE SEEN NONE SEEN   WBC, Wet Prep HPF POC FEW (A) NONE SEEN    Imaging Studies:  [place] Mfm Ob Detail + 14 Wk  04/10/2015   OBSTETRICAL ULTRASOUND: This exam was performed within a [place] [facility]. The OB US report was generated in the AS system, and faxed to the ordering physician.   This report is available in the [employer]. See the AS Obstetric US report via the Image Link.  [place] Mfm Ob Follow Up  05/08/2015   OBSTETRICAL ULTRASOUND: This  exam was performed within a [place] [facility]. The OB US report was generated in the AS system, and faxed to the ordering physician.   This report is available in the [employer]. See the AS Obstetric US report via the Image Link.   Assessment: Yolanda Baird is  24 y.o. [phone] at [redacted]w[redacted]d presents with Contractions  Plan: Patient with intermittent contractions, wanted to observe for 2 hours and recheck cervix. Patient stated she had to leave due to only having one ride so recheck was done at 1.5h with no change. Patient still feeling contractions occasionally. Recommended patient stay for BMZ injection but patient declined due to ride. Patient signed out AMA. Patient did agree to come tomorrow morning for BMZ injection 1 of 2. Gave preterm labor precautions and advised not to leave prior to BMZ injection in case patient truly is in preterm labor and explained lungs not developed yet. Patient voiced understanding. Obtained: UA, UDS, Gc/Chlamydia, wet prep At time patient left, UA negative, UDS positive for marijuana only, and wet prep negative. Gc/Chlamydia had not resulted.   Durenda Hurt 9/8/20161:47 PM   OB fellow attestation: I have seen and examined this patient; I agree with above documentation in the resident;s note. Please see other MAU provider note for further details.   Federico Flake, MD 2:42 PM

## 2015-05-09 ENCOUNTER — Ambulatory Visit (INDEPENDENT_AMBULATORY_CARE_PROVIDER_SITE_OTHER): Payer: Medicaid Other | Admitting: General Practice

## 2015-05-09 VITALS — BP 91/66 | HR 101 | Temp 98.0°F | Ht 66.0 in | Wt 196.4 lb

## 2015-05-09 DIAGNOSIS — O4703 False labor before 37 completed weeks of gestation, third trimester: Secondary | ICD-10-CM

## 2015-05-09 LAB — GC/CHLAMYDIA PROBE AMP (~~LOC~~) NOT AT ARMC
CHLAMYDIA, DNA PROBE: NEGATIVE
NEISSERIA GONORRHEA: NEGATIVE

## 2015-05-09 MED ORDER — BETAMETHASONE SOD PHOS & ACET 6 (3-3) MG/ML IJ SUSP
12.0000 mg | Freq: Once | INTRAMUSCULAR | Status: AC
  Administered 2015-05-09: 12 mg via INTRAMUSCULAR

## 2015-05-10 ENCOUNTER — Encounter: Payer: Self-pay | Admitting: Obstetrics and Gynecology

## 2015-05-10 ENCOUNTER — Inpatient Hospital Stay (EMERGENCY_DEPARTMENT_HOSPITAL)
Admission: AD | Admit: 2015-05-10 | Discharge: 2015-05-10 | Disposition: A | Discharge status: Home / Self Care | Payer: Medicaid Other | Source: Ambulatory Visit | Attending: Obstetrics & Gynecology | Admitting: Obstetrics & Gynecology

## 2015-05-10 ENCOUNTER — Encounter (HOSPITAL_COMMUNITY): Payer: Self-pay | Admitting: *Deleted

## 2015-05-10 DIAGNOSIS — O4703 False labor before 37 completed weeks of gestation, third trimester: Secondary | ICD-10-CM

## 2015-05-10 DIAGNOSIS — O4700 False labor before 37 completed weeks of gestation, unspecified trimester: Secondary | ICD-10-CM

## 2015-05-10 DIAGNOSIS — Z3A31 31 weeks gestation of pregnancy: Secondary | ICD-10-CM

## 2015-05-10 LAB — URINALYSIS, ROUTINE W REFLEX MICROSCOPIC
BILIRUBIN URINE: NEGATIVE
Glucose, UA: NEGATIVE mg/dL
HGB URINE DIPSTICK: NEGATIVE
Ketones, ur: NEGATIVE mg/dL
Leukocytes, UA: NEGATIVE
Nitrite: NEGATIVE
Protein, ur: NEGATIVE mg/dL
SPECIFIC GRAVITY, URINE: 1.015 (ref 1.005–1.030)
UROBILINOGEN UA: 0.2 mg/dL (ref 0.0–1.0)
pH: 7 (ref 5.0–8.0)

## 2015-05-10 LAB — FETAL FIBRONECTIN: FETAL FIBRONECTIN: NEGATIVE

## 2015-05-10 MED ORDER — BETAMETHASONE SOD PHOS & ACET 6 (3-3) MG/ML IJ SUSP
12.0000 mg | Freq: Once | INTRAMUSCULAR | Status: AC
  Administered 2015-05-10: 12 mg via INTRAMUSCULAR
  Filled 2015-05-10: qty 2

## 2015-05-10 NOTE — MAU Note (Signed)
Pt states here for abd pain that began Sunday. Left AMA last MAU visit. Noted pinkish mucous when wiping today. Intermittent contractions.

## 2015-05-10 NOTE — Discharge Instructions (Signed)

## 2015-05-10 NOTE — MAU Provider Note (Signed)
History     CSN: [id number]  Arrival date and time: 05/08/15 1004   First Provider Initiated Contact with Patient 05/08/15 1131      Chief Complaint  Patient presents with  . Contractions   HPIBriana Baird is a 24 y.o. [id number] with IUP at [redacted]w[redacted]d presenting for Contractions   Patient states she has been having irregular, every 0-15 minutes contractions, no vaginal bleeding but did have pink spotting in cervical mucous today, intact membranes, with active fetal movement. Patient started feeling contractions for the past few days, earlier this AM having contractions q87min then increased in frequency to q67min. Denies any LOF.  OB History    Gravida Para Term Preterm AB TAB SAB Ectopic Multiple Living   5 2 1 1 2  0 2 0 0 1      Past Medical History  Diagnosis Date  . Bipolar 1 disorder   . Anemia   . Anxiety   . Depression   . GERD (gastroesophageal reflux disease)   . Headache   . Bipolar disorder     Past Surgical History  Procedure Laterality Date  . Tympanostomy tube placement      Family History  Problem Relation Age of Onset  . Diabetes Maternal Grandmother     Social History  Substance Use Topics  . Smoking status: Current Every Day Smoker -- 0.00 packs/day    Types: Cigarettes  . Smokeless tobacco: Never Used  . Alcohol Use: No    Allergies: No Known Allergies  No prescriptions prior to admission    Review of Systems  Constitutional: Negative for fever and chills.  Eyes: Negative for blurred vision and double vision.  Respiratory: Negative for cough and shortness of breath.   Cardiovascular: Negative for chest pain and orthopnea.  Gastrointestinal: Negative for nausea and vomiting.  Genitourinary: Negative for dysuria, frequency and flank pain.  Musculoskeletal: Negative for myalgias.  Skin: Negative for rash.  Neurological: Negative for dizziness, tingling, weakness and headaches.  Endo/Heme/Allergies: Does not bruise/bleed easily.   Psychiatric/Behavioral: Negative for depression and suicidal ideas. The patient is not nervous/anxious.    Physical Exam   Blood pressure 123/66, pulse 82, temperature 98.3 F (36.8 C), temperature source Oral, resp. rate 18, height 5\' 7"  (1.702 m), last menstrual period 10/07/2014.  Physical Exam  Constitutional: She is oriented to person, place, and time. She appears well-developed and well-nourished. No distress.  Pregnant female  HENT:  Head: Normocephalic and atraumatic.  Eyes: Conjunctivae are normal. No scleral icterus.  Neck: Normal range of motion. Neck supple.  Cardiovascular: Normal rate and intact distal pulses.   Respiratory: Effort normal. She exhibits no tenderness.  GI: Soft. There is no tenderness. There is no rebound and no guarding.  Gravid  Genitourinary: Vagina normal.  Initial cervical exam: 1.5/50/-2 Repeat (1.5 hrs): 1.5/50/-2  Musculoskeletal: Normal range of motion. She exhibits no edema.  Neurological: She is alert and oriented to person, place, and time.  Skin: Skin is warm and dry. No rash noted.  Psychiatric: She has a normal mood and affect.    MAU Course  Procedures  MDM NST: 145/mod/+accels/no decels Contractions: irritable  UA- negative for infection UDS- negative Gc/Chlamydia wet prep -negative  Assessment and Plan  Yolanda Baird is 24 y.o. [id number] at [redacted]w[redacted]d presents with contractions  #Threatended preterm labor: Patient with intermittent contractions/irritability. Plan was to observe for 2 hours and recheck cervix given concern for preterm labor.  Patient stated she had to leave due to only having  one ride so recheck was done at 1.5h with no change. Patient still feeling contractions occasionally. Recommended patient stay for BMZ injection but patient declined due lack of transportation. We discussed the concern for preterm labor and risk of having infant without proper lung maturity. Patient voiced understanding and signed out AMA. Patient  did agree to [facility]9 morning for BMZ injection 1 of 2- this appt was made prior to discharge.   Yolanda Baird [facility] 05/10/2015, 2:43 PM

## 2015-05-10 NOTE — MAU Provider Note (Signed)
History     CSN: [id number] Arrival date and time: 05/10/15 1001   Chief Complaint  Patient presents with  . Abdominal Pain   HPI Patient is 24 y.o. [id number] [redacted]w[redacted]d here with complaints of intermittent back pain/contractions.  +FM, denies LOF, vaginal discharge.  Seen in MAU on 9/8 and was unable to stay for BMZ. She was seen in clinic on 9/9 and given BMZ. Cervix was 1.5cm on 9/8 in MAU.   Has had continued spotting- slightly increased since Thursday. Back pain is stronger now compared to Thursday.  Reports nausea today, no vomiting. Endorses vaginal pressure Ctx are every 10 minutes and have been about this frequent for past 2 days but they are increased intensity. 5/10 to 7-8/10  OB History    Gravida Para Term Preterm AB TAB SAB Ectopic Multiple Living   0 2 0 0 1      Past Medical History  Diagnosis Date  . Bipolar 1 disorder   . Anemia   . Anxiety   . Depression   . GERD (gastroesophageal reflux disease)   . Headache   . Bipolar disorder     Past Surgical History  Procedure Laterality Date  . Tympanostomy tube placement      Family History  Problem Relation Age of Onset  . Diabetes Maternal Grandmother     Social History  Substance Use Topics  . Smoking status: Current Every Day Smoker -- 0.00 packs/day    Types: Cigarettes  . Smokeless tobacco: Never Used  . Alcohol Use: No    Allergies: No Known Allergies  Prescriptions prior to admission  Medication Sig Dispense Refill Last Dose  . Prenatal Vit-Fe Fumarate-FA (PRENATAL FORMULA PO) Take 2 tablets by mouth at bedtime.    05/09/2015 at Unknown time    Review of Systems  Constitutional: Negative for fever and chills.  Eyes: Negative for blurred vision and double vision.  Respiratory: Negative for cough and shortness of breath.   Cardiovascular: Negative for chest pain and orthopnea.  Gastrointestinal: Negative for nausea and vomiting.  Genitourinary: Negative for dysuria, frequency and  flank pain.  Musculoskeletal: Negative for myalgias.  Skin: Negative for rash.  Neurological: Negative for dizziness, tingling, weakness and headaches.  Endo/Heme/Allergies: Does not bruise/bleed easily.  Psychiatric/Behavioral: Negative for depression and suicidal ideas. The patient is not nervous/anxious.    Physical Exam   Blood pressure 124/62, pulse 107, temperature 98.2 F (36.8 C), temperature source Oral, resp. rate 18, height  (1.676 m), weight 196 lb 6 oz (89.075 kg), last menstrual period 10/07/2014.  Physical Exam  Nursing note and vitals reviewed. Constitutional: She is oriented to person, place, and time. She appears well-developed and well-nourished. No distress.  Pregnant female  HENT:  Head: Normocephalic and atraumatic.  Eyes: Conjunctivae are normal. No scleral icterus.  Neck: Normal range of motion. Neck supple.  Cardiovascular: Normal rate and intact distal pulses.   Respiratory: Effort normal. She exhibits no tenderness.  GI: Soft. There is no tenderness. There is no rebound and no guarding.  Gravid  Genitourinary: Vagina normal.  Musculoskeletal: Normal range of motion. She exhibits no edema.  Neurological: She is alert and oriented to person, place, and time.  Skin: Skin is warm and dry. No rash noted.  Psychiatric: She has a normal mood and affect.   Dilation: 2 Effacement (%): 50 Cervical Position: Posterior Station: Ballotable Presentation: Vertex Exam by:: Karyl Kinnier MD  Thursday 9/8 bleeding      //////  Friday 9/9 bleeding     MAU Course  Procedures  MDM NST 155/mod/+accels (multiple) no decels Received BMZ on 9/9 at 0848 BMZ#2 ordered today FFN -Neg  Assessment and Plan  Alani Sabbagh 24 y.o. [id number] [redacted]w[redacted]d here with contractions and vaginal spotting  #Threatended PTL: minimal change on cervical exam despite more intense contractions. FFN negative which is similarly reassuring. - BMZ #2 today - FFN collected today - Return  precautions discussed in detail  #FWB: Reactive NST. BMZ received today for lung maturation.   Isa Rankin Rick Warnick 05/10/2015, 11:22 AM

## 2015-05-11 ENCOUNTER — Inpatient Hospital Stay (HOSPITAL_COMMUNITY)
Admission: AD | Admit: 2015-05-11 | Discharge: 2015-05-16 | DRG: 767 | Disposition: A | Discharge status: Home / Self Care | Payer: Medicaid Other | Source: Ambulatory Visit | Attending: Obstetrics & Gynecology | Admitting: Obstetrics & Gynecology

## 2015-05-11 ENCOUNTER — Encounter (HOSPITAL_COMMUNITY): Payer: Self-pay

## 2015-05-11 DIAGNOSIS — T380X5A Adverse effect of glucocorticoids and synthetic analogues, initial encounter: Secondary | ICD-10-CM | POA: Diagnosis not present

## 2015-05-11 DIAGNOSIS — O42913 Preterm premature rupture of membranes, unspecified as to length of time between rupture and onset of labor, third trimester: Principal | ICD-10-CM | POA: Diagnosis present

## 2015-05-11 DIAGNOSIS — F419 Anxiety disorder, unspecified: Secondary | ICD-10-CM | POA: Diagnosis present

## 2015-05-11 DIAGNOSIS — Z3A31 31 weeks gestation of pregnancy: Secondary | ICD-10-CM | POA: Diagnosis not present

## 2015-05-11 DIAGNOSIS — O0933 Supervision of pregnancy with insufficient antenatal care, third trimester: Secondary | ICD-10-CM | POA: Diagnosis not present

## 2015-05-11 DIAGNOSIS — D72829 Elevated white blood cell count, unspecified: Secondary | ICD-10-CM | POA: Diagnosis not present

## 2015-05-11 DIAGNOSIS — Z3A32 32 weeks gestation of pregnancy: Secondary | ICD-10-CM | POA: Diagnosis present

## 2015-05-11 DIAGNOSIS — O411230 Chorioamnionitis, third trimester, not applicable or unspecified: Secondary | ICD-10-CM | POA: Diagnosis present

## 2015-05-11 DIAGNOSIS — D649 Anemia, unspecified: Secondary | ICD-10-CM | POA: Diagnosis present

## 2015-05-11 DIAGNOSIS — K219 Gastro-esophageal reflux disease without esophagitis: Secondary | ICD-10-CM | POA: Diagnosis present

## 2015-05-11 DIAGNOSIS — F319 Bipolar disorder, unspecified: Secondary | ICD-10-CM | POA: Diagnosis present

## 2015-05-11 DIAGNOSIS — O09293 Supervision of pregnancy with other poor reproductive or obstetric history, third trimester: Secondary | ICD-10-CM | POA: Diagnosis not present

## 2015-05-11 DIAGNOSIS — O9989 Other specified diseases and conditions complicating pregnancy, childbirth and the puerperium: Secondary | ICD-10-CM | POA: Diagnosis present

## 2015-05-11 DIAGNOSIS — F17210 Nicotine dependence, cigarettes, uncomplicated: Secondary | ICD-10-CM | POA: Diagnosis present

## 2015-05-11 DIAGNOSIS — O99334 Smoking (tobacco) complicating childbirth: Secondary | ICD-10-CM | POA: Diagnosis present

## 2015-05-11 DIAGNOSIS — O4703 False labor before 37 completed weeks of gestation, third trimester: Secondary | ICD-10-CM | POA: Diagnosis not present

## 2015-05-11 DIAGNOSIS — O9902 Anemia complicating childbirth: Secondary | ICD-10-CM | POA: Diagnosis present

## 2015-05-11 DIAGNOSIS — O99613 Diseases of the digestive system complicating pregnancy, third trimester: Secondary | ICD-10-CM | POA: Diagnosis present

## 2015-05-11 DIAGNOSIS — O42919 Preterm premature rupture of membranes, unspecified as to length of time between rupture and onset of labor, unspecified trimester: Secondary | ICD-10-CM | POA: Insufficient documentation

## 2015-05-11 DIAGNOSIS — O99344 Other mental disorders complicating childbirth: Secondary | ICD-10-CM | POA: Diagnosis present

## 2015-05-11 LAB — CBC WITH DIFFERENTIAL/PLATELET
BASOS ABS: 0.1 10*3/uL (ref 0.0–0.1)
BASOS PCT: 0 % (ref 0–1)
EOS PCT: 0 % (ref 0–5)
Eosinophils Absolute: 0 10*3/uL (ref 0.0–0.7)
HCT: 30 % — ABNORMAL LOW (ref 36.0–46.0)
Hemoglobin: 10.6 g/dL — ABNORMAL LOW (ref 12.0–15.0)
Lymphocytes Relative: 9 % — ABNORMAL LOW (ref 12–46)
Lymphs Abs: 2.8 10*3/uL (ref 0.7–4.0)
MCH: 31.4 pg (ref 26.0–34.0)
MCHC: 35.3 g/dL (ref 30.0–36.0)
MCV: 88.8 fL (ref 78.0–100.0)
MONO ABS: 2 10*3/uL — AB (ref 0.1–1.0)
MONOS PCT: 7 % (ref 3–12)
Neutro Abs: 24.5 10*3/uL — ABNORMAL HIGH (ref 1.7–7.7)
Neutrophils Relative %: 84 % — ABNORMAL HIGH (ref 43–77)
PLATELETS: 310 10*3/uL (ref 150–400)
RBC: 3.38 MIL/uL — ABNORMAL LOW (ref 3.87–5.11)
RDW: 13.1 % (ref 11.5–15.5)
WBC: 29.4 10*3/uL — ABNORMAL HIGH (ref 4.0–10.5)

## 2015-05-11 LAB — TYPE AND SCREEN
ABO/RH(D): B POS
ANTIBODY SCREEN: NEGATIVE

## 2015-05-11 LAB — POCT FERN TEST: POCT FERN TEST: POSITIVE

## 2015-05-11 LAB — OB RESULTS CONSOLE GBS: GBS: NEGATIVE

## 2015-05-11 MED ORDER — NICOTINE 14 MG/24HR TD PT24
14.0000 mg | MEDICATED_PATCH | Freq: Every day | TRANSDERMAL | Status: DC
  Administered 2015-05-11 – 2015-05-14 (×4): 14 mg via TRANSDERMAL
  Filled 2015-05-11 (×6): qty 1

## 2015-05-11 MED ORDER — AMOXICILLIN 500 MG PO CAPS
500.0000 mg | ORAL_CAPSULE | Freq: Three times a day (TID) | ORAL | Status: DC
  Filled 2015-05-11 (×2): qty 1

## 2015-05-11 MED ORDER — MAGNESIUM SULFATE BOLUS VIA INFUSION
4.0000 g | Freq: Once | INTRAVENOUS | Status: AC
  Administered 2015-05-11: 4 g via INTRAVENOUS
  Filled 2015-05-11: qty 500

## 2015-05-11 MED ORDER — ZOLPIDEM TARTRATE 5 MG PO TABS
5.0000 mg | ORAL_TABLET | Freq: Every evening | ORAL | Status: DC | PRN
  Administered 2015-05-11 – 2015-05-12 (×2): 5 mg via ORAL
  Filled 2015-05-11 (×2): qty 1

## 2015-05-11 MED ORDER — LACTATED RINGERS IV SOLN
INTRAVENOUS | Status: DC
  Administered 2015-05-11 – 2015-05-13 (×9): via INTRAVENOUS

## 2015-05-11 MED ORDER — ACETAMINOPHEN 325 MG PO TABS
650.0000 mg | ORAL_TABLET | ORAL | Status: DC | PRN
  Administered 2015-05-11: 650 mg via ORAL
  Filled 2015-05-11: qty 2

## 2015-05-11 MED ORDER — PRENATAL MULTIVITAMIN CH
1.0000 | ORAL_TABLET | Freq: Every day | ORAL | Status: DC
  Filled 2015-05-11: qty 1

## 2015-05-11 MED ORDER — AZITHROMYCIN 250 MG PO TABS: 500.0000 mg | ORAL_TABLET | Freq: Every day | ORAL | Status: DC

## 2015-05-11 MED ORDER — DOCUSATE SODIUM 100 MG PO CAPS
100.0000 mg | ORAL_CAPSULE | Freq: Every day | ORAL | Status: DC
  Administered 2015-05-11 – 2015-05-12 (×2): 100 mg via ORAL
  Filled 2015-05-11 (×2): qty 1

## 2015-05-11 MED ORDER — MAGNESIUM SULFATE 50 % IJ SOLN
2.0000 g/h | INTRAVENOUS | Status: AC
  Administered 2015-05-11: 2 g/h via INTRAVENOUS
  Filled 2015-05-11: qty 80

## 2015-05-11 MED ORDER — DEXTROSE 5 % IV SOLN
500.0000 mg | INTRAVENOUS | Status: AC
  Administered 2015-05-11 – 2015-05-12 (×2): 500 mg via INTRAVENOUS
  Filled 2015-05-11 (×2): qty 500

## 2015-05-11 MED ORDER — CALCIUM CARBONATE ANTACID 500 MG PO CHEW: 2.0000 | CHEWABLE_TABLET | ORAL | Status: DC | PRN

## 2015-05-11 MED ORDER — SODIUM CHLORIDE 0.9 % IV SOLN
2.0000 g | Freq: Four times a day (QID) | INTRAVENOUS | Status: AC
  Administered 2015-05-11 – 2015-05-12 (×8): 2 g via INTRAVENOUS
  Filled 2015-05-11 (×8): qty 2000

## 2015-05-11 NOTE — MAU Note (Signed)
Thinks water broke about 2400. States was here earlier today and bleeding all day. States bleeding like a period. States tried to have BM and tried to urinate but could not. But when stood up had a gush of fld. Feels like may have stopped now. Occ pain lower abd. Has not felt FM since 1500 on Sat

## 2015-05-11 NOTE — Progress Notes (Signed)
Acknowledged order for social work consult to assess mother's hx of substance abuse and mental illness.  Patient admitted for PPROM and is [redacted] weeks pregnant.  Patient was pleasant and receptive to CSW.  Informed that she moved to [place] from [place] when she found out she was pregnant to be near her mother.    FOB is reportedly uninvolved and "I expect no support from him".  He is also incarcerated.  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  She reports hx of miscarriage and still birth.  She has a 23 y/o that was placed in foster care and her parental rights were terminated.   Mother was tearful as she spoke about this child.  She was informed of referral that would be made to DSS once she delivers, and this was very upsetting to her.  Provided extensive support and focused on the positive things that she has been doing to prepare for this pregnancy.  She fears that she would lose custody of this child.  Provided supportive feedback and encouragement.  She is currently residing with her mother and stepfather.  And, they are reportedly very supportive of her and newborn.  Mother states that she was diagnosed with bipolar at age 42 and started on medication.  However, when she went to live with her father, he felt she didn't need the medication and since then, she has been reluctant to take medication.  Informed that she was hospitalized in a psychiatric facility for the first time in 2013.   "I voluntarily signed myself in".   She reports no other psychiatric hospitalization.   She admits to smoking marijuana noting that it helps to stabilize her mood.  However, she states that she hasn't smoke any in the past couple of months, and was surprised that she tested positive for marijuana on admission.   She also reports hx of cocaine use and stated she quit using prior to pregnancy.  She became preoccupied with the possibility of losing custody of this  baby once she was informed of referral that will be made to DSS after she delivers.   Informed that she has all needed baby care items and feels mentally and emotionally prepared to parent this child.  Validated her feelings and encouraged positive thinking.  Patient informed of CSW availability.

## 2015-05-11 NOTE — MAU Note (Signed)
Report called to christy RN on antenatal. Will go to 152

## 2015-05-11 NOTE — H&P (Signed)
FACULTY PRACTICE ANTEPARTUM ADMISSION HISTORY AND PHYSICAL NOTE  History of Present Illness: Yolanda Baird is a 24 y.o. [id number] at [redacted]w[redacted]d By Gaetana Michaelis) with pregnancy complicated by scant PNC, history of IUFD at 21 weeks and tobacco use.  Patient presented on 9/8 for contractions and bleeding. She has had spotting since 9/8 and returned to clinic and the MAU for BMZ and repeat exams.  She presented this morning with loss of fluid  9/11@0000  Patient reports the fetal movement as decreased . Patient reports uterine contraction  activity as none. Patient reports  vaginal bleeding as spotting. Patient describes fluid per vagina as Clear. Fetal presentation is cephalic.  Denies vaginal discharge, dysuria, abdominal pain. Denies fevers, chills, nausea, vomiting.   Patient Active Problem List   Diagnosis Date Noted  . Preterm premature rupture of membranes (PPROM) with unknown onset of labor 05/11/2015  . Tobacco use during pregnancy 04/21/2015  . Bipolar disorder 04/07/2015  . Supervision of normal pregnancy 04/07/2015  . History of stillbirth 04/07/2015    Past Medical History  Diagnosis Date  . Bipolar 1 disorder   . Anemia   . Anxiety   . Depression   . GERD (gastroesophageal reflux disease)   . Headache   . Bipolar disorder     Past Surgical History  Procedure Laterality Date  . Tympanostomy tube placement      OB History  Gravida Para Term Preterm AB SAB TAB Ectopic Multiple Living  5 2 1 1 2 2  0 0 0 1    # Outcome Date GA Lbr Len/2nd Weight Sex Delivery Anes PTL Lv  5 Current           4 Term 11/01/11 [redacted]w[redacted]d  7 lb 6 oz (3.345 kg) F Vag-Spont EPI N Y  3 Preterm  [redacted]w[redacted]d      N FD  2 SAB           1 SAB               Social History   Social History  . Marital Status: Single    Spouse Name: N/A  . Number of Children: N/A  . Years of Education: N/A   Social History Main Topics  . Smoking status: Current Every Day Smoker -- 0.00 packs/day    Types: Cigarettes  .  Smokeless tobacco: Never Used  . Alcohol Use: No  . Drug Use: No     Comment: quit marijuana 02/28/2015, stopped cocaine with this pregnancy  . Sexual Activity: Not Currently   Other Topics Concern  . None   Social History Narrative    Family History  Problem Relation Age of Onset  . Diabetes Maternal Grandmother     No Known Allergies  Prescriptions prior to admission  Medication Sig Dispense Refill Last Dose  . Prenatal Vit-Fe Fumarate-FA (PRENATAL FORMULA PO) Take 2 tablets by mouth at bedtime.    05/10/2015 at Unknown time    Review of Systems - Negative except those in HPI  Vitals:  BP 113/69 mmHg  Pulse 100  Temp(Src) 98.2 F (36.8 C)  Resp 18  Ht 5\' 7"  (1.702 m)  Wt 199 lb (90.266 kg)  BMI 31.16 kg/m2  LMP 10/07/2014 Physical Examination: CONSTITUTIONAL: Well-developed, well-nourished female in no acute distress.  HENT:  Normocephalic, atraumatic, External right and left ear normal. Oropharynx is clear and moist EYES: Conjunctivae and EOM are normal. Pupils are equal, round, and reactive to light. No scleral icterus.  NECK: Normal range  of motion, supple, no masses SKIN: Skin is warm and dry. No rash noted. Not diaphoretic. No erythema. No pallor. NEUROLGIC: Alert and oriented to person, place, and time. Normal reflexes, muscle tone coordination. No cranial nerve deficit noted. PSYCHIATRIC: Normal mood and affect. Normal behavior. Normal judgment and thought content. CARDIOVASCULAR: Normal heart rate noted, regular rhythm RESPIRATORY: Effort and breath sounds normal, no problems with respiration noted ABDOMEN: Soft, nontender, nondistended, gravid. MUSCULOSKELETAL: Normal range of motion. No edema and no tenderness. 2+ distal pulses.  Cervix: Not evaluated- but was evaluated recently and found to be 2/50/ballotable (on 9/10 at 11:22) Membranes:ruptured, clear fluid, Fern POS Fetal Monitoring:Baseline: 150 bpm, Variability: Good {> 6 bpm) and Accelerations:  Reactive, one variable deceleration Tocometer: Flat  Labs:  Results for orders placed or performed during the hospital encounter of 05/11/15 (from the past 24 hour(s))  Fern Test   Collection Time: 05/11/15  1:01 AM  Result Value Ref Range   POCT Fern Test Positive = ruptured amniotic membanes   Results for orders placed or performed during the hospital encounter of 05/10/15 (from the past 24 hour(s))  Urinalysis, Routine w reflex microscopic (not at [facility])   Collection Time: 05/10/15 10:26 AM  Result Value Ref Range   Color, Urine YELLOW YELLOW   APPearance CLEAR CLEAR   Specific Gravity, Urine 1.015 1.005 - 1.030   pH 7.0 5.0 - 8.0   Glucose, UA NEGATIVE NEGATIVE mg/dL   Hgb urine dipstick NEGATIVE NEGATIVE   Bilirubin Urine NEGATIVE NEGATIVE   Ketones, ur NEGATIVE NEGATIVE mg/dL   Protein, ur NEGATIVE NEGATIVE mg/dL   Urobilinogen, UA 0.2 0.0 - 1.0 mg/dL   Nitrite NEGATIVE NEGATIVE   Leukocytes, UA NEGATIVE NEGATIVE  Fetal fibronectin   Collection Time: 05/10/15 11:44 AM  Result Value Ref Range   Fetal Fibronectin NEGATIVE NEGATIVE    Imaging Studies: 04/10/2015: Anatomy scan - wnl, anterior placenta EFW 1170g (59th%) 05/08/2015:  2051g, 76th%, AFI=7.2cm  Assessment and Plan: Patient Active Problem List   Diagnosis Date Noted  . Preterm premature rupture of membranes (PPROM) with unknown onset of labor 05/11/2015  . Tobacco use during pregnancy 04/21/2015  . Bipolar disorder 04/07/2015  . Supervision of normal pregnancy 04/07/2015  . History of stillbirth 04/07/2015   #PPROM: Patient presented several times over the last 3 days with PTL s/sx and was treated with BMZ on 9/9 and 9/10. She presented to MAU grossly ruptured with positive ferning - Admit to Antenatal - Routine antenatal care - Will start latency abx- Ampicillin and Azithromycin - baseline labs - plan for CBC q48 hours to monitor  - GBS culture  #FWB: Reactive. BMZ on 9/9 and 9/10. EFW on 9/8  2051g - Will start magnesium for neuroprotection as the patient is just 32 weeks and this will also serve as tocolysis until lung maturity. - NST BID with FHT q8 - NICU consult - Social work consult  Federico Flake, MD OB Fellow Faculty Practice, [facility]

## 2015-05-11 NOTE — Plan of Care (Signed)
Problem: Consults Goal: Case Manager Consult Outcome: Progressing Social work consult

## 2015-05-12 ENCOUNTER — Encounter: Payer: Medicaid Other | Admitting: Obstetrics and Gynecology

## 2015-05-12 LAB — CBC WITH DIFFERENTIAL/PLATELET
BAND NEUTROPHILS: 1 % (ref 0–10)
BASOS PCT: 0 % (ref 0–1)
Basophils Absolute: 0 10*3/uL (ref 0.0–0.1)
Blasts: 0 %
EOS ABS: 0.2 10*3/uL (ref 0.0–0.7)
Eosinophils Relative: 1 % (ref 0–5)
HEMATOCRIT: 31.7 % — AB (ref 36.0–46.0)
Hemoglobin: 10.7 g/dL — ABNORMAL LOW (ref 12.0–15.0)
LYMPHS ABS: 3.9 10*3/uL (ref 0.7–4.0)
LYMPHS PCT: 18 % (ref 12–46)
MCH: 30.7 pg (ref 26.0–34.0)
MCHC: 33.8 g/dL (ref 30.0–36.0)
MCV: 91.1 fL (ref 78.0–100.0)
MONO ABS: 0.9 10*3/uL (ref 0.1–1.0)
Metamyelocytes Relative: 0 %
Monocytes Relative: 4 % (ref 3–12)
Myelocytes: 0 %
NEUTROS ABS: 16.5 10*3/uL — AB (ref 1.7–7.7)
Neutrophils Relative %: 76 % (ref 43–77)
OTHER: 0 %
PLATELETS: 346 10*3/uL (ref 150–400)
PROMYELOCYTES ABS: 0 %
RBC: 3.48 MIL/uL — ABNORMAL LOW (ref 3.87–5.11)
RDW: 13.7 % (ref 11.5–15.5)
WBC: 21.5 10*3/uL — ABNORMAL HIGH (ref 4.0–10.5)
nRBC: 0 /100 WBC

## 2015-05-12 LAB — CULTURE, BETA STREP (GROUP B ONLY)

## 2015-05-12 MED ORDER — BUTORPHANOL TARTRATE 1 MG/ML IJ SOLN
1.0000 mg | Freq: Once | INTRAMUSCULAR | Status: AC
  Administered 2015-05-12: 1 mg via INTRAVENOUS
  Filled 2015-05-12: qty 1

## 2015-05-12 MED ORDER — OXYCODONE-ACETAMINOPHEN 5-325 MG PO TABS
1.0000 | ORAL_TABLET | ORAL | Status: DC | PRN
  Administered 2015-05-12: 2 via ORAL
  Filled 2015-05-12: qty 2

## 2015-05-12 MED ORDER — MAGNESIUM SULFATE 50 % IJ SOLN
2.0000 g/h | INTRAVENOUS | Status: DC
  Filled 2015-05-12: qty 80

## 2015-05-12 MED ORDER — GUAIFENESIN ER 600 MG PO TB12
600.0000 mg | ORAL_TABLET | Freq: Two times a day (BID) | ORAL | Status: DC | PRN
  Administered 2015-05-12: 600 mg via ORAL
  Filled 2015-05-12 (×2): qty 1

## 2015-05-12 MED ORDER — MAGNESIUM SULFATE BOLUS VIA INFUSION
4.0000 g | Freq: Once | INTRAVENOUS | Status: AC
  Administered 2015-05-12: 4 g via INTRAVENOUS
  Filled 2015-05-12: qty 500

## 2015-05-12 NOTE — Consult Note (Signed)
Neonatology Consult to Antenatal Patient:  I was asked byAdrian Blackwater Baird to see this patient in order to provide antenatal counseling due to premature ROM at 32 2/7 weeks.  Yolanda Baird was admitted yesterday and is now 16 2/[redacted] weeks GA. She has had very little PNC, has bipolar disorder, and is a cigarette smoker. She had decreased fetal movement and had SROM about 21 hours ago. She is currently very uncomfortable, but without cervical change. She got BMZ on 9/9-10 and IV Ampicillin and Azithromycin (now oral antibiotics). She is on Magnesium sulfate. The baby is a female.  I spoke with the patient alone, although her mother, who is her support person, arrived near the end. The father of the baby is not involved. We discussed the worst case of delivery in the next 1-2 days, including usual DR management, possible respiratory complications and need for support, IV access, feedings (mother desires bottle feeding), LOS, Mortality and Morbidity, and long term outcomes. She was quite uncomfortable and did not have any questions at this time. I offered a NICU tour to her mother and would be glad to come back if she has more questions later.  Thank you for asking me to see this patient.  Doretha Sou, MD Neonatologist  The total length of face-to-face or floor/unit time for this encounter was 20 minutes. Counseling and/or coordination of care was 15 minutes of the above.

## 2015-05-12 NOTE — Progress Notes (Signed)
Patient ID: Yolanda Baird, female   DOB: [birth year], 24 y.o.   MRN: [id number]  Patient having contractions q 2-3 minutes.  Rates pain 8/10.  No change in cervix.  Percocet helps minimally.  Will start magnesium for neuro protection.   BP 114/64 mmHg  Pulse 72  Temp(Src) 98.1 F (36.7 C) (Oral)  Resp 20  Ht  (1.702 m)  Wt 199 lb (90.266 kg)  BMI 31.16 kg/m2  SpO2 96%  LMP 10/07/2014  CBC Latest Ref Rng 05/12/2015 05/11/2015 04/07/2015  WBC 4.0 - 10.5 K/uL 21.5(H) 29.4(H) 16.1(H)  Hemoglobin 12.0 - 15.0 g/dL 10.7(L) 10.6(L) 11.9(L)  Hematocrit 36.0 - 46.0 % 31.7(L) 30.0(L) 35.6(L)  Platelets 150 - 400 K/uL 346 310 276    CBC improved.  No evidence of intrauterine infection. Will continue to observe.  If goes into active labor, will expectantly manage.  Levie Heritage, DO 05/12/2015 8:58 PM

## 2015-05-12 NOTE — Progress Notes (Signed)
Patient ID: Yolanda Baird, female   DOB: Sep 22, 1990, 24 y.o.   MRN: [id number] FACULTY PRACTICE ANTEPARTUM(COMPREHENSIVE) NOTE  Yolanda Baird is a 24 y.o. [id number] with Estimated Date of Delivery: 07/05/15   By  midtrimester ultrasound [redacted]w[redacted]d  who is admitted for PROM.    Fetal presentation is cephalic. Length of Stay:  1  Days  Date of admission:05/11/2015  Subjective:  Patient reports the fetal movement as active. Patient reports uterine contraction  activity as none. Patient reports  vaginal bleeding as none. Patient describes fluid per vagina as Clear.  Vitals:  Blood pressure 111/48, pulse 94, temperature 98.6 F (37 C), temperature source Oral, resp. rate 17, height  (1.702 m), weight 199 lb (90.266 kg), last menstrual period 10/07/2014, SpO2 96 %. Filed Vitals:   05/11/15 1613 05/11/15 1803 05/11/15 2018 05/12/15 0009  BP: 106/44 114/47 102/49 111/48  Pulse: 82 89 86 94  Temp:   98 F (36.7 C) 98.6 F (37 C)  TempSrc:    Oral  Resp:   18 17  Height:      Weight:      SpO2:       Physical Examination:  General appearance - alert, well appearing, and in no distress Abdomen - soft non tender gravid Fundal Height:  size equals dates Pelvic Exam:   Cervical Exam:   Extremities: extremities normal, atraumatic, no cyanosis or edema with DTRs 2+ bilaterally Membranes:intact, ruptured  Fetal Monitoring:  Baseline: 145 bpm, Variability: Good {> 6 bpm) and Accelerations: Reactive   reactive  Labs:  No results found for this or any previous visit (from the past 24 hour(s)).  GBS pending  Imaging Studies:     Medications:  Scheduled . ampicillin (OMNIPEN) IV  2 g Intravenous Q6H   Followed by  . [START ON 05/13/2015] amoxicillin  500 mg Oral Q8H  . [START ON 05/13/2015] azithromycin  500 mg Oral Daily  . docusate sodium  100 mg Oral Daily  . nicotine  14 mg Transdermal Daily  . prenatal multivitamin  1 tablet Oral Q1200   I have reviewed the patient's current  medications.  ASSESSMENT: [id number] [redacted]w[redacted]d Estimated Date of Delivery: 07/05/15  PPROM, no labor Elevated WBC secondary to steroids Patient Active Problem List   Diagnosis Date Noted  . Preterm premature rupture of membranes (PPROM) with unknown onset of labor 05/11/2015  . Tobacco use during pregnancy 04/21/2015  . Bipolar disorder 04/07/2015  . Supervision of normal pregnancy 04/07/2015  . History of stillbirth 04/07/2015    PLAN: S/p betamethasone On latency antibiotics  Harland Aguiniga H 05/12/2015,7:31 AM

## 2015-05-13 ENCOUNTER — Inpatient Hospital Stay (HOSPITAL_COMMUNITY): Payer: Medicaid Other | Admitting: Anesthesiology

## 2015-05-13 LAB — CBC WITH DIFFERENTIAL/PLATELET
BASOS PCT: 0 % (ref 0–1)
Basophils Absolute: 0 10*3/uL (ref 0.0–0.1)
EOS ABS: 0.3 10*3/uL (ref 0.0–0.7)
EOS PCT: 1 % (ref 0–5)
HCT: 28.3 % — ABNORMAL LOW (ref 36.0–46.0)
Hemoglobin: 9.7 g/dL — ABNORMAL LOW (ref 12.0–15.0)
LYMPHS ABS: 4.4 10*3/uL — AB (ref 0.7–4.0)
Lymphocytes Relative: 22 % (ref 12–46)
MCH: 31 pg (ref 26.0–34.0)
MCHC: 34.3 g/dL (ref 30.0–36.0)
MCV: 90.4 fL (ref 78.0–100.0)
MONOS PCT: 11 % (ref 3–12)
Monocytes Absolute: 2.2 10*3/uL — ABNORMAL HIGH (ref 0.1–1.0)
Neutro Abs: 13.4 10*3/uL — ABNORMAL HIGH (ref 1.7–7.7)
Neutrophils Relative %: 66 % (ref 43–77)
PLATELETS: 319 10*3/uL (ref 150–400)
RBC: 3.13 MIL/uL — ABNORMAL LOW (ref 3.87–5.11)
RDW: 13.5 % (ref 11.5–15.5)
WBC: 20.3 10*3/uL — ABNORMAL HIGH (ref 4.0–10.5)

## 2015-05-13 MED ORDER — PHENYLEPHRINE 40 MCG/ML (10ML) SYRINGE FOR IV PUSH (FOR BLOOD PRESSURE SUPPORT)
80.0000 ug | PREFILLED_SYRINGE | INTRAVENOUS | Status: DC | PRN
  Filled 2015-05-13: qty 2
  Filled 2015-05-13: qty 20

## 2015-05-13 MED ORDER — DEXTROSE 5 % IV SOLN
170.0000 mg | Freq: Three times a day (TID) | INTRAVENOUS | Status: DC
  Administered 2015-05-13 – 2015-05-14 (×5): 170 mg via INTRAVENOUS
  Filled 2015-05-13 (×7): qty 4.25

## 2015-05-13 MED ORDER — ONDANSETRON HCL 4 MG/2ML IJ SOLN
4.0000 mg | Freq: Four times a day (QID) | INTRAMUSCULAR | Status: DC | PRN
  Administered 2015-05-13 – 2015-05-14 (×2): 4 mg via INTRAVENOUS
  Filled 2015-05-13 (×2): qty 2

## 2015-05-13 MED ORDER — OXYTOCIN 40 UNITS IN LACTATED RINGERS INFUSION - SIMPLE MED
1.0000 m[IU]/min | INTRAVENOUS | Status: DC
  Administered 2015-05-13: 2 m[IU]/min via INTRAVENOUS
  Administered 2015-05-14: 20 m[IU]/min via INTRAVENOUS
  Filled 2015-05-13 (×2): qty 1000

## 2015-05-13 MED ORDER — OXYTOCIN 40 UNITS IN LACTATED RINGERS INFUSION - SIMPLE MED
62.5000 mL/h | INTRAVENOUS | Status: DC
  Administered 2015-05-14: 62.5 mL/h via INTRAVENOUS

## 2015-05-13 MED ORDER — OXYCODONE-ACETAMINOPHEN 5-325 MG PO TABS
1.0000 | ORAL_TABLET | ORAL | Status: DC | PRN
  Filled 2015-05-13: qty 1

## 2015-05-13 MED ORDER — OXYTOCIN BOLUS FROM INFUSION: 500.0000 mL | INTRAVENOUS | Status: DC

## 2015-05-13 MED ORDER — OXYCODONE-ACETAMINOPHEN 5-325 MG PO TABS: 2.0000 | ORAL_TABLET | ORAL | Status: DC | PRN

## 2015-05-13 MED ORDER — ACETAMINOPHEN 325 MG PO TABS: 650.0000 mg | ORAL_TABLET | ORAL | Status: DC | PRN

## 2015-05-13 MED ORDER — FENTANYL CITRATE (PF) 100 MCG/2ML IJ SOLN
50.0000 ug | INTRAMUSCULAR | Status: DC | PRN
  Administered 2015-05-13 (×2): 50 ug via INTRAVENOUS
  Filled 2015-05-13 (×2): qty 2

## 2015-05-13 MED ORDER — LACTATED RINGERS IV SOLN: INTRAVENOUS | Status: DC

## 2015-05-13 MED ORDER — FENTANYL 2.5 MCG/ML BUPIVACAINE 1/10 % EPIDURAL INFUSION (WH - ANES)
14.0000 mL/h | INTRAMUSCULAR | Status: DC | PRN
  Administered 2015-05-13 – 2015-05-14 (×5): 14 mL/h via EPIDURAL
  Filled 2015-05-13 (×4): qty 125

## 2015-05-13 MED ORDER — EPHEDRINE 5 MG/ML INJ
10.0000 mg | INTRAVENOUS | Status: DC | PRN
  Filled 2015-05-13: qty 2

## 2015-05-13 MED ORDER — LACTATED RINGERS IV SOLN
500.0000 mL | INTRAVENOUS | Status: DC | PRN
  Administered 2015-05-13: 500 mL via INTRAVENOUS

## 2015-05-13 MED ORDER — LIDOCAINE HCL (PF) 1 % IJ SOLN
30.0000 mL | INTRAMUSCULAR | Status: DC | PRN
  Filled 2015-05-13: qty 30

## 2015-05-13 MED ORDER — CITRIC ACID-SODIUM CITRATE 334-500 MG/5ML PO SOLN: 30.0000 mL | ORAL | Status: DC | PRN

## 2015-05-13 MED ORDER — SODIUM CHLORIDE 0.9 % IV SOLN
2.0000 g | Freq: Four times a day (QID) | INTRAVENOUS | Status: DC
  Administered 2015-05-13 – 2015-05-14 (×6): 2 g via INTRAVENOUS
  Filled 2015-05-13 (×8): qty 2000

## 2015-05-13 MED ORDER — LIDOCAINE HCL (PF) 1 % IJ SOLN
INTRAMUSCULAR | Status: DC | PRN
  Administered 2015-05-13: 4 mL via EPIDURAL
  Administered 2015-05-13: 2 mL via EPIDURAL
  Administered 2015-05-13: 4 mL via EPIDURAL

## 2015-05-13 MED ORDER — FLEET ENEMA 7-19 GM/118ML RE ENEM: 1.0000 | ENEMA | RECTAL | Status: DC | PRN

## 2015-05-13 MED ORDER — DIPHENHYDRAMINE HCL 50 MG/ML IJ SOLN: 12.5000 mg | INTRAMUSCULAR | Status: DC | PRN

## 2015-05-13 NOTE — Progress Notes (Signed)
ANTIBIOTIC CONSULT NOTE - INITIAL  Pharmacy Consult for Gentamicin Indication: Chorioamnionitis   No Known Allergies  Patient Measurements: Height:  (170.2 cm) Weight: 199 lb (90.266 kg) IBW/kg (Calculated) : 61.6 Adjusted Body Weight: 70.2  Vital Signs: Temp: 98 F (36.7 C) (09/13 0424) Temp Source: Oral (09/13 0424) BP: 118/65 mmHg (09/13 0431) Pulse Rate: 81 (09/13 0431)  Labs:  Recent Labs  05/11/15 0130 05/12/15 1812  WBC 29.4* 21.5*  HGB 10.6* 10.7*  PLT 310 346   No results for input(s): GENTTROUGH, GENTPEAK, GENTRANDOM in the last 72 hours.   Microbiology: Recent Results (from the past 720 hour(s))  Wet prep, genital     Status: Abnormal   Collection Time: 05/08/15 12:05 PM  Result Value Ref Range Status   Yeast Wet Prep HPF POC NONE SEEN NONE SEEN Final   Trich, Wet Prep NONE SEEN NONE SEEN Final   Clue Cells Wet Prep HPF POC NONE SEEN NONE SEEN Final   WBC, Wet Prep HPF POC FEW (A) NONE SEEN Final    Comment: FEW BACTERIA SEEN  OB RESULT CONSOLE Group B Strep     Status: None   Collection Time: 05/11/15 12:00 AM  Result Value Ref Range Status   GBS Negative  Final  Culture, beta strep (group b only)     Status: None   Collection Time: 05/11/15  2:30 AM  Result Value Ref Range Status   Specimen Description VAGINAL/RECTAL  Final   Special Requests NONE  Final   Culture   Final    NO GROUP B STREP (S.AGALACTIAE) ISOLATED Performed at [facility]    Report Status 05/12/2015 FINAL  Final    Medications:  Ampicillin 2 grams Q 6 hours  Assessment: 24 y.o. female [id number] at [redacted]w[redacted]d with tenderness on cervical exam concerning for chorio. Estimated Ke = 0.419, Vd = 0.35  Goal of Therapy:  Gentamicin peak 6-8 mg/L and Trough < 1 mg/L  Plan:  Gentamicin 170 mg IV every 8 hrs  Check Scr with next labs if gentamicin continued. Will check gentamicin levels if continued > 72hr or clinically indicated.  Loyola Mast 05/13/2015,4:46 AM

## 2015-05-13 NOTE — Progress Notes (Signed)
Patient ID: Yolanda Baird, female   DOB: 08-Jan-1991, 24 y.o.   MRN: [id number] Yolanda Baird is a 24 y.o. [id number] at [redacted]w[redacted]d.  Subjective: Very uncomfortable w/ UC's. No improvement after Stadol. States she doesn't like feeling "drunk" from stadol. Also C/O nausea.   Objective: BP 113/63 mmHg  Pulse 79  Temp(Src) 98.1 F (36.7 C) (Oral)  Resp 20  Ht  (1.702 m)  Wt 199 lb (90.266 kg)  BMI 31.16 kg/m2  SpO2 99%  LMP 10/07/2014   FHT:  FHR: 140 bpm, variability: mod,  accelerations:  15x15,  decelerations: ? Variables vs intermittent tracing of MHR. Adjusted.  UC:   Q 2-3 minutes, moderate  Fundus NT Dilation: 1.5 Effacement (%): 50 Cervical Position: Middle Station: -2 Presentation: Vertex Cervix very tender on exam. Pt reports that this was not present two days ago when she was admitted.   Exam by::Jeanett Antonopoulos Katrinka Blazing, CNM  Labs: Results for orders placed or performed during the hospital encounter of 05/11/15 (from the past 24 hour(s))  CBC with Differential/Platelet     Status: Abnormal   Collection Time: 05/12/15  6:12 PM  Result Value Ref Range   WBC 21.5 (H) 4.0 - 10.5 K/uL   RBC 3.48 (L) 3.87 - 5.11 MIL/uL   Hemoglobin 10.7 (L) 12.0 - 15.0 g/dL   HCT 62.1 (L) 30.8 - 65.7 %   MCV 91.1 78.0 - 100.0 fL   MCH 30.7 26.0 - 34.0 pg   MCHC 33.8 30.0 - 36.0 g/dL   RDW 84.6 96.2 - 95.2 %   Platelets 346 150 - 400 K/uL   Neutrophils Relative % 76 43 - 77 %   Lymphocytes Relative 18 12 - 46 %   Monocytes Relative 4 3 - 12 %   Eosinophils Relative 1 0 - 5 %   Basophils Relative 0 0 - 1 %   Band Neutrophils 1 0 - 10 %   Metamyelocytes Relative 0 %   Myelocytes 0 %   Promyelocytes Absolute 0 %   Blasts 0 %   nRBC 0 0 /100 WBC   Other 0 %   Neutro Abs 16.5 (H) 1.7 - 7.7 K/uL   Lymphs Abs 3.9 0.7 - 4.0 K/uL   Monocytes Absolute 0.9 0.1 - 1.0 K/uL   Eosinophils Absolute 0.2 0.0 - 0.7 K/uL   Basophils Absolute 0.0 0.0 - 0.1 K/uL    Assessment / Plan: [redacted]w[redacted]d week IUP Labor:  Prodromal Fetal Wellbeing:  Category I-II Pain Control:  Stadol--not helping Anticipated MOD:  SVD Pt with continued, painful UC's that persisted even after Mag Sulfate restarted. New onset significant tenderness on cervical exam concernign for development of Chorio. Per consult w/ Adrian Blackwater will transfer to [facility] for Augmentation w/ Foley Balloon. Pt and family notified. Discussed risks of prematurity vs risks of prolonging pregnancy with chorio present. Pt and agrees w/ POC.  Will try Fentanyl for pain as needed--Hopefully pt will not feels as sedated.   [place], CNM 05/13/2015 12:56 AM

## 2015-05-13 NOTE — Anesthesia Preprocedure Evaluation (Signed)
Anesthesia Evaluation  Patient identified by MRN, date of birth, ID band Patient awake    Reviewed: Allergy & Precautions, H&P , Patient's Chart, lab work & pertinent test results  Airway Mallampati: I  TM Distance: >3 FB Neck ROM: full    Dental no notable dental hx.    Pulmonary neg pulmonary ROS, Current Smoker,    Pulmonary exam normal breath sounds clear to auscultation       Cardiovascular negative cardio ROS Normal cardiovascular exam Rhythm:regular Rate:Normal     Neuro/Psych  Headaches, Anxiety Depression Bipolar Disorder negative neurological ROS     GI/Hepatic Neg liver ROS, GERD  ,  Endo/Other  negative endocrine ROS  Renal/GU negative Renal ROS  negative genitourinary   Musculoskeletal   Abdominal (+) + obese,   Peds  Hematology  (+) anemia ,   Anesthesia Other Findings Pregnancy - uncomplicated Platelets and allergies reviewed Denies active cardiac or pulmonary symptoms, METS > 4  Denies blood thinning medications, bleeding disorders, hypertension, asthma, supine hypotension syndrome  She reports previous epidural unsatisfactory due to lack of analgesia   Reproductive/Obstetrics (+) Pregnancy                             Anesthesia Physical Anesthesia Plan  ASA: III  Anesthesia Plan: Epidural   Post-op Pain Management:    Induction:   Airway Management Planned:   Additional Equipment:   Intra-op Plan:   Post-operative Plan:   Informed Consent: I have reviewed the patients History and Physical, chart, labs and discussed the procedure including the risks, benefits and alternatives for the proposed anesthesia with the patient or authorized representative who has indicated his/her understanding and acceptance.     Plan Discussed with:   Anesthesia Plan Comments:         Anesthesia Quick Evaluation

## 2015-05-13 NOTE — Anesthesia Procedure Notes (Signed)
Epidural Patient location during procedure: OB  Staffing Anesthesiologist: Kayra Crowell Performed by: anesthesiologist   Preanesthetic Checklist Completed: patient identified, site marked, surgical consent, pre-op evaluation, timeout performed, IV checked, risks and benefits discussed and monitors and equipment checked  Epidural Patient position: sitting Prep: site prepped and draped and DuraPrep Patient monitoring: continuous pulse ox and blood pressure Approach: midline Location: L2-L3 Injection technique: LOR saline  Needle:  Needle type: Tuohy  Needle gauge: 17 G Needle length: 9 cm and 9 Needle insertion depth: 5.5 cm Catheter type: closed end flexible Catheter size: 19 Gauge Catheter at skin depth: 10 cm Test dose: negative  Assessment Events: blood not aspirated, injection not painful, no injection resistance, negative IV test and no paresthesia  Additional Notes Patient identified. Risks/Benefits/Options discussed with patient including but not limited to bleeding, infection, nerve damage, paralysis, failed block, incomplete pain control, headache, blood pressure changes, nausea, vomiting, reactions to medications, itching and postpartum back pain. Confirmed with bedside nurse the patient's most recent platelet count. Confirmed with patient that they are not currently taking any anticoagulation, have any bleeding history or any family history of bleeding disorders. Patient expressed understanding and wished to proceed. All questions were answered. Sterile technique was used throughout the entire procedure. Please see nursing notes for vital signs. Test dose was given through epidural catheter and negative prior to continuing to dose epidural or start infusion. Warning signs of high block given to the patient including shortness of breath, tingling/numbness in hands, complete motor block, or any concerning symptoms with instructions to call for help. Patient was given  instructions on fall risk and not to get out of bed. All questions and concerns addressed with instructions to call with any issues or inadequate analgesia.  Reason for block:procedure for pain

## 2015-05-13 NOTE — Progress Notes (Addendum)
Labor Progress Note Yolanda Baird is a 24 y.o. [id number] at [redacted]w[redacted]d presented for PPROM, s/p BMZ x2 on 9/9 and 9/10.  S:  Foley bulb fell out  O:  BP 93/51 mmHg  Pulse 77  Temp(Src) 98.2 F (36.8 C) (Oral)  Resp 18  Ht  (1.702 m)  Wt 199 lb (90.266 kg)  BMI 31.16 kg/m2  SpO2 98%  LMP 10/07/2014 EFM: 135/mod/accels, no decels CVE:  3-4cm/90/-1  A&P: 24 y.o. [id number] [redacted]w[redacted]d here with PPROM #Labor:  Induction - Start pitocin #Pain:epidural on maternal request #FWB: Cat I. S/p BMZ and is 48 hrs from dosing. On Mag for neuroprotection for 13 hrs- received magnesium previously on 9/11 and this was restarted when her labor induction was started. will stop and allow to eat prior to pitocin.  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

## 2015-05-13 NOTE — Progress Notes (Addendum)
Yolanda Baird is a 24 y.o. [id number] at [redacted]w[redacted]d by LMP admitted for PPROM  Subjective: Patient transferred to L&D for cervical ripening and IOL due to interval development of chorio. Patient doing well, no acute complaints  Objective: BP 118/65 mmHg   Pulse 81   Temp(Src) 98 F (36.7 C) (Oral)   Resp 20   Ht  (1.702 m)   Wt 199 lb (90.266 kg)   BMI 31.16 kg/m2   SpO2 98%   LMP 10/07/2014 I/O last 3 completed shifts: In: 5265 [P.O.:1640; I.V.:3375; IV Piggyback:250] Out: 2250 [Urine:2250] Total I/O In: 727.5 [P.O.:240; I.V.:487.5] Out: 1200 [Urine:1200]  FHT:  FHR: 150 bpm, variability: moderate,  accelerations:  Present,  decelerations:  Absent UC:  Irregular, intermittent SVE:  1.5/50/-3 Labs: Lab Results  Component Value Date   WBC 21.5* 05/12/2015   HGB 10.7* 05/12/2015   HCT 31.7* 05/12/2015   MCV 91.1 05/12/2015   PLT 346 05/12/2015    Assessment / Plan: IOL due to PPROM and chorio  Labor: Foley bulb placed at 0400 Fetal Wellbeing:  Category I Pain Control:  Fentanyl I/D:  GBS negative; Chorio-tx with gent/amp Anticipated MOD:  NSVD  Durenda Hurt 05/13/2015, 4:45 AM

## 2015-05-13 NOTE — Plan of Care (Signed)
Problem: Consults Goal: Birthing Suites Patient Information Press F2 to bring up selections list  Outcome: Completed/Met Date Met:  05/13/15  Pt < [redacted] weeks EGA

## 2015-05-13 NOTE — Plan of Care (Signed)
Problem: Phase I Progression Outcomes Goal: FHR checked 5 minutes after meds (ROM) Rupture of Membranes SROM at home     

## 2015-05-14 ENCOUNTER — Encounter (HOSPITAL_COMMUNITY): Payer: Self-pay | Admitting: *Deleted

## 2015-05-14 DIAGNOSIS — Z3A32 32 weeks gestation of pregnancy: Secondary | ICD-10-CM

## 2015-05-14 DIAGNOSIS — O99334 Smoking (tobacco) complicating childbirth: Secondary | ICD-10-CM

## 2015-05-14 DIAGNOSIS — O99344 Other mental disorders complicating childbirth: Secondary | ICD-10-CM

## 2015-05-14 DIAGNOSIS — O42913 Preterm premature rupture of membranes, unspecified as to length of time between rupture and onset of labor, third trimester: Secondary | ICD-10-CM

## 2015-05-14 DIAGNOSIS — F319 Bipolar disorder, unspecified: Secondary | ICD-10-CM

## 2015-05-14 LAB — BASIC METABOLIC PANEL
ANION GAP: 6 (ref 5–15)
CO2: 24 mmol/L (ref 22–32)
Calcium: 8.2 mg/dL — ABNORMAL LOW (ref 8.9–10.3)
Chloride: 103 mmol/L (ref 101–111)
Creatinine, Ser: 0.42 mg/dL — ABNORMAL LOW (ref 0.44–1.00)
GFR calc Af Amer: >60 mL/min (ref >60)
GLUCOSE: 82 mg/dL (ref 65–99)
POTASSIUM: 4.1 mmol/L (ref 3.5–5.1)
Sodium: 133 mmol/L — ABNORMAL LOW (ref 135–145)

## 2015-05-14 LAB — TYPE AND SCREEN
ABO/RH(D): B POS
ANTIBODY SCREEN: NEGATIVE

## 2015-05-14 LAB — RPR: RPR Ser Ql: NONREACTIVE

## 2015-05-14 MED ORDER — DIPHENHYDRAMINE HCL 25 MG PO CAPS: 25.0000 mg | ORAL_CAPSULE | Freq: Four times a day (QID) | ORAL | Status: DC | PRN

## 2015-05-14 MED ORDER — SIMETHICONE 80 MG PO CHEW
80.0000 mg | CHEWABLE_TABLET | ORAL | Status: DC | PRN
  Administered 2015-05-15: 80 mg via ORAL
  Filled 2015-05-14: qty 1

## 2015-05-14 MED ORDER — HYDROXYZINE HCL 50 MG PO TABS
25.0000 mg | ORAL_TABLET | Freq: Four times a day (QID) | ORAL | Status: DC | PRN
  Administered 2015-05-14: 25 mg via ORAL
  Filled 2015-05-14 (×2): qty 1

## 2015-05-14 MED ORDER — BENZOCAINE-MENTHOL 20-0.5 % EX AERO
1.0000 "application " | INHALATION_SPRAY | CUTANEOUS | Status: DC | PRN
  Administered 2015-05-14: 1 via TOPICAL
  Filled 2015-05-14: qty 56

## 2015-05-14 MED ORDER — WITCH HAZEL-GLYCERIN EX PADS
1.0000 | MEDICATED_PAD | CUTANEOUS | Status: DC | PRN
Start: 2015-05-14 — End: 2015-05-16

## 2015-05-14 MED ORDER — MISOPROSTOL 200 MCG PO TABS: 1000.0000 ug | ORAL_TABLET | Freq: Once | ORAL | Status: DC

## 2015-05-14 MED ORDER — ONDANSETRON HCL 4 MG/2ML IJ SOLN: 4.0000 mg | INTRAMUSCULAR | Status: DC | PRN

## 2015-05-14 MED ORDER — ACETAMINOPHEN 325 MG PO TABS: 650.0000 mg | ORAL_TABLET | ORAL | Status: DC | PRN

## 2015-05-14 MED ORDER — ONDANSETRON HCL 4 MG PO TABS: 4.0000 mg | ORAL_TABLET | ORAL | Status: DC | PRN

## 2015-05-14 MED ORDER — TETANUS-DIPHTH-ACELL PERTUSSIS 5-2.5-18.5 LF-MCG/0.5 IM SUSP: 0.5000 mL | Freq: Once | INTRAMUSCULAR | Status: DC

## 2015-05-14 MED ORDER — LANOLIN HYDROUS EX OINT: TOPICAL_OINTMENT | CUTANEOUS | Status: DC | PRN

## 2015-05-14 MED ORDER — IBUPROFEN 600 MG PO TABS
600.0000 mg | ORAL_TABLET | Freq: Four times a day (QID) | ORAL | Status: DC
  Administered 2015-05-14 – 2015-05-16 (×8): 600 mg via ORAL
  Filled 2015-05-14 (×8): qty 1

## 2015-05-14 MED ORDER — ZOLPIDEM TARTRATE 5 MG PO TABS
5.0000 mg | ORAL_TABLET | Freq: Every evening | ORAL | Status: DC | PRN
  Administered 2015-05-15 – 2015-05-16 (×2): 5 mg via ORAL
  Filled 2015-05-14 (×2): qty 1

## 2015-05-14 MED ORDER — OXYCODONE-ACETAMINOPHEN 5-325 MG PO TABS
1.0000 | ORAL_TABLET | ORAL | Status: DC | PRN
  Administered 2015-05-14 – 2015-05-15 (×3): 1 via ORAL
  Filled 2015-05-14 (×2): qty 1

## 2015-05-14 MED ORDER — PRENATAL MULTIVITAMIN CH
1.0000 | ORAL_TABLET | Freq: Every day | ORAL | Status: DC
  Administered 2015-05-15 – 2015-05-16 (×2): 1 via ORAL
  Filled 2015-05-14 (×2): qty 1

## 2015-05-14 MED ORDER — OXYCODONE-ACETAMINOPHEN 5-325 MG PO TABS
2.0000 | ORAL_TABLET | ORAL | Status: DC | PRN
  Administered 2015-05-15: 2 via ORAL
  Filled 2015-05-14: qty 2

## 2015-05-14 MED ORDER — MISOPROSTOL 200 MCG PO TABS
ORAL_TABLET | ORAL | Status: AC
  Filled 2015-05-14: qty 5

## 2015-05-14 MED ORDER — DIBUCAINE 1 % RE OINT: 1.0000 "application " | TOPICAL_OINTMENT | RECTAL | Status: DC | PRN

## 2015-05-14 MED ORDER — SENNOSIDES-DOCUSATE SODIUM 8.6-50 MG PO TABS
2.0000 | ORAL_TABLET | ORAL | Status: DC
  Administered 2015-05-14 – 2015-05-16 (×2): 2 via ORAL
  Filled 2015-05-14 (×2): qty 2

## 2015-05-14 MED ORDER — DEXTROSE 10 % NICU IV FLUID BOLUS: 4.0000 mL | INJECTION | Freq: Once | INTRAVENOUS | Status: DC

## 2015-05-14 NOTE — Progress Notes (Signed)
Patient ID: Yolanda Baird, female   DOB: [birth year], 24 y.o.   MRN: [id number] Yolanda Baird is a 24 y.o. [id number] at [redacted]w[redacted]d.  Subjective: Still having painful UC's w/ epidural.   Objective: BP 114/56 mmHg  Pulse 72  Temp(Src) 98.4 F (36.9 C) (Oral)  Resp 18  Ht  (1.702 m)  Wt 199 lb (90.266 kg)  BMI 31.16 kg/m2  SpO2 98%  LMP 10/07/2014   FHT:  FHR: 130 bpm, variability: mod,  accelerations:  15x15,  decelerations:  Few variables vs intermittent tracing of MHR. EFM adjusted UC:   Q 2-4 minutes, moderate. Pitocin at 18 MilliUnits/min Uterus tender Dilation: 3 Effacement (%): 70 Cervical Position: Anterior Station: -2 Presentation: Vertex Exam by:: v Jamicah Anstead cnm  Labs: Results for orders placed or performed during the hospital encounter of 05/11/15 (from the past 24 hour(s))  CBC with Differential/Platelet     Status: Abnormal   Collection Time: 05/13/15  5:00 AM  Result Value Ref Range   WBC 20.3 (H) 4.0 - 10.5 K/uL   RBC 3.13 (L) 3.87 - 5.11 MIL/uL   Hemoglobin 9.7 (L) 12.0 - 15.0 g/dL   HCT 14.7 (L) 82.9 - 56.2 %   MCV 90.4 78.0 - 100.0 fL   MCH 31.0 26.0 - 34.0 pg   MCHC 34.3 30.0 - 36.0 g/dL   RDW 13.0 86.5 - 78.4 %   Platelets 319 150 - 400 K/uL   Neutrophils Relative % 66 43 - 77 %   Neutro Abs 13.4 (H) 1.7 - 7.7 K/uL   Lymphocytes Relative 22 12 - 46 %   Lymphs Abs 4.4 (H) 0.7 - 4.0 K/uL   Monocytes Relative 11 3 - 12 %   Monocytes Absolute 2.2 (H) 0.1 - 1.0 K/uL   Eosinophils Relative 1 0 - 5 %   Eosinophils Absolute 0.3 0.0 - 0.7 K/uL   Basophils Relative 0 0 - 1 %   Basophils Absolute 0.0 0.0 - 0.1 K/uL    Assessment / Plan: [redacted]w[redacted]d week IUP Labor: Prolonged latent labor, suspected chorio due to significant uterine tenderness, PPROM Fetal Wellbeing:  Category I Pain Control:  Epidural Anticipated MOD:  SVD Continue increasing pitocin. IUPC if no change at next exam.   Dorathy Kinsman, CNM 05/13/2015 10:44 PM

## 2015-05-14 NOTE — Progress Notes (Signed)
This RN Candis Schatz RNC assuming care for patient

## 2015-05-14 NOTE — Progress Notes (Signed)
Yolanda Baird is a 24 y.o. [id number] at [redacted]w[redacted]d admitted for PROM.  Pt being induced for Triple I suspected.  Currently on atbx for treatment of Triple I.  Subjective: Pt with epidural does not report pain at present.  I went to introduce myself and pt expressed frustration over prolonged process.   I spent a prolonged period of time explaining to pt that she and baby are both well and that cervical ripening is a process.  Asked her to refocus on the fact that she would have been pregnant much longer if she did not get infected.   Objective: BP 133/78 mmHg  Pulse 84  Temp(Src) 98 F (36.7 C) (Oral)  Resp 18  Ht  (1.702 m)  Wt 199 lb (90.266 kg)  BMI 31.16 kg/m2  SpO2 98%  LMP 10/07/2014 I/O last 3 completed shifts: In: 2798.8 [P.O.:582; I.V.:2162.5; IV Piggyback:54.3] Out: 4650 [Urine:4650]    FHT:  FHR: 140's bpm, variability: moderate,  accelerations:  Present,  decelerations:  Absent UC:   regular, every 2-3 minutes SVE:   Dilation: 3.5 Effacement (%): 70 Station: -2 Exam by:: harraway smith AROM if forebag  Labs: Lab Results  Component Value Date   WBC 20.3* 05/13/2015   HGB 9.7* 05/13/2015   HCT 28.3* 05/13/2015   MCV 90.4 05/13/2015   PLT 319 05/13/2015    Assessment / Plan: cervical ripening.  No active labor at present. Fetal Wellbeing:  Category I Pain Control:  Epidural I/D:  n/a Anticipated MOD:  NSVD Pt smiling and comfortable after discussion HARRAWAY-SMITH, Jakyrie Totherow 05/14/2015, 12:24 PM

## 2015-05-14 NOTE — Progress Notes (Addendum)
Labor Progress Note  S: Patient with increased uterine tenderness, not feeling contractions as has epidural.  O:  BP 117/70 mmHg   Pulse 73   Temp(Src) 98.4 F (36.9 C) (Oral)   Resp 18   Ht  (1.702 m)   Wt 199 lb (90.266 kg)   BMI 31.16 kg/m2   SpO2 98%   LMP 10/07/2014 EFM: FHR: 140, moderate variability, +accels 15x15, -decels, contractions q2-55min AVW:UJWJXBJY: 3 Effacement (%): 70 Cervical Position: Anterior Station: -2 Presentation: Vertex Exam by:: Foye Clock RN   A&P: 24 y.o. [id number] [redacted]w[redacted]d induction for PPROM with chorio #Labor: failure to progress, placed IUPC at 0154 for better titration of pitocin #FWB: category 1 tracing #GBS: negative # ID: Chorio- tx w/ gent/amp  Durenda Hurt Resident Physician 2:58 AM

## 2015-05-15 NOTE — Anesthesia Postprocedure Evaluation (Signed)
  Anesthesia Post-op Note  Patient: Yolanda Baird  Procedure(s) Performed: * No procedures listed *  Patient Location: [facility] Unit  Anesthesia Type:Epidural  Level of Consciousness: awake, alert , oriented and patient cooperative  Airway and Oxygen Therapy: Patient Spontanous Breathing  Post-op Pain: none  Post-op Assessment: Post-op Vital signs reviewed, Patient's Cardiovascular Status Stable, Respiratory Function Stable, Patent Airway, No signs of Nausea or vomiting, Adequate PO intake, Pain level controlled, No headache, No backache, Spinal receding and Patient able to bend at knees              Post-op Vital Signs: Reviewed and stable  Last Vitals:  Filed Vitals:   05/15/15 0458  BP: 113/64  Pulse: 61  Temp: 36.9 C  Resp: 16    Complications: No apparent anesthesia complications

## 2015-05-15 NOTE — Progress Notes (Signed)
Post Partum Day 1  Subjective:  Yolanda Baird is a 24 y.o. [id number] [redacted]w[redacted]d s/p NSVD.  No acute events overnight.  Pt denies problems with ambulating, voiding or po intake.  She denies nausea or vomiting.  Pain is moderately controlled.  She has had flatus. She has had bowel movement.  Lochia Moderate.  Plan for birth control is undecided.  Method of Feeding: Bottle.  Objective: BP 113/64 mmHg  Pulse 61  Temp(Src) 98.5 F (36.9 C) (Oral)  Resp 16  Ht  (1.702 m)  Wt 199 lb (90.266 kg)  BMI 31.16 kg/m2  SpO2 97%  LMP 10/07/2014  Breastfeeding? Unknown  Physical Exam:  General: alert, cooperative and no distress Lochia:normal flow Chest: non-labored breathing Heart: regular rate Abdomen: soft, slightly tender, fundus firm at/below umbilicus Uterine Fundus: firm DVT Evaluation: No evidence of DVT seen on physical exam. Extremities: trace edema   Recent Labs  05/12/15 1812 05/13/15 0500  HGB 10.7* 9.7*  HCT 31.7* 28.3*    Assessment/Plan:  ASSESSMENT: Yolanda Baird is a 24 y.o. [id number] [redacted]w[redacted]d ppd #1 s/p NSVD doing well.   Plan for discharge tomorrow   LOS: 4 days   Tarri Abernethy, MD PGY-1 [facility]  05/15/2015, 7:55 AM   CNM attestation Post Partum Day #1 I have seen and examined this patient and agree with above documentation in the resident's note.   Yolanda Baird is a 24 y.o. [id number] s/p SVD.  Pt denies problems with ambulating, voiding or po intake. Pain is well controlled.  Plan for birth control is undecided.  Method of Feeding: bottle  PE:  BP 113/64 mmHg  Pulse 61  Temp(Src) 98.5 F (36.9 C) (Oral)  Resp 16  Ht  (1.702 m)  Wt 90.266 kg (199 lb)  BMI 31.16 kg/m2  SpO2 97%  LMP 10/07/2014  Breastfeeding? Unknown Fundus firm  Plan for discharge: 05/16/15  Cam Hai, CNM 9:14 AM 05/15/2015

## 2015-05-15 NOTE — Clinical Social Work Maternal (Signed)
CLINICAL SOCIAL WORK MATERNAL/CHILD NOTE  Patient Details  Name: Yolanda Baird MRN: [id number] Date of Birth: 04/23/1991  Date: 05/15/2015  Clinical Social Worker Initiating Note: Keelyn Monjaras E. Ji Feldner, LCSWDate/ Time Initiated: 05/15/15/1430   Child's Name: Yolanda Baird   Legal Guardian: Mother   Need for Interpreter: None   Date of Referral: 05/15/15   Reason for Referral: Behavioral Health Issues, including SI  (No custody of first child.)   Referral Source: RN   Address: [address] [place], [place] [address]  Phone number: [phone]   Household Members: Parents (MOB states she lives with her mother)   Natural Supports (not living in the home):     Professional Supports: (MOB states her mother has contacted a Mental Health Provider, and is awaiting a returned call. MOB did not agree to have CSW make a referral for her.)   Employment:    Type of Work:     Education:     Financial Resources:Medicaid   Other Resources:     Cultural/Religious Considerations Which May Impact Care:None stated.  Strengths: Ability to meet basic needs , Compliance with medical plan , Home prepared for child    Risk Factors/Current Problems: Mental Health Concerns , Other (Comment) (No custody of 23 year old.)   Cognitive State: Distractible , Goal Oriented , Paranoid , Thought Blocking    Mood/Affect: Agitated , Angry , Tearful , Anxious    CSW Assessment:CSW met with MOB in her third floor room/23 to introduce myself, offer support, and complete assessment due to NICU admission at 32.4 weeks, as well as no custody of other 23, limited PNC, and Bipolar Disorder. MOB states that this is a good time to talk with her. Before rapport could be established, MOB immediately said, "I just want to take my baby home with me." CSW hears that her goal is to take her baby home and expressed that no one has said she will not be taking her  baby home. CSW stating understanding that MOB has previously met with a CSW over the weekend when she was a patient on the Antenatal Unit. MOB was told by weekend CSW that a report to CPS would be made since she does not have custody of her 3 year old daughter. CSW explained purpose of understanding MOB's current situation so that strengths can be presented to CPS when report is made. MOB was hysterically crying throughout entire assessment. She states she signed away her rights to her first child so that her mother could take custody, and she has never know why her mother was not given custody. CSW asked if her child was in foster care at that time and she said yes. CSW explained that if CPS becomes involved, they will want to ensure that MOB's situation is different with her previous child. MOB yelled that nothing is different this time and that "I don't trust f*c*ing social workers." CSW gave her the option of not speaking to CSW, and attempted to explain that CSW does not work for CPS or make decisions about a baby's disposition. MOB states she has everything she needs for baby at home and "if you take my baby away, you are signing my death certificate." CSW again explained that although CPS needs to be notified, it has not been determined that her baby will not go home with her. CSW asked if we could discuss what to expect if CPS gets involved, but MOB appears to be unable to focus on anything than losing   her baby. CSW acknowledged the difficulty of the situation. MOB yelled, "I have Bipolar." CSW asked if we could talk about that and offered to make a referral for Mental Health Care. MOB declined offer and replied, "that's what everyone comes in here and says." She states her mother has already contacted an agency, but she does not know the name. CSW states that CSW can assist her in being linked with services if she wishes. MOB was very difficult to de-escalate, and while this is an  upsetting conversation, CSW is concerned with MOB's reaction and lack of coping mechanisms. CSW asked MOB if she was agreeable to CSW following up with her regarding CPS involvement. She states she would like CSW to keep her informed. CSW provided MOB with CSW contact number and asked her to call if needed.   CSW Plan/Description: Patient/Family Education , Psychosocial Support and Ongoing Assessment of Needs, Child Protective Service Report     Eron Staat Elizabeth, LCSW 05/15/2015, 3:21 PM         

## 2015-05-15 NOTE — Progress Notes (Signed)
CSW was on hold for nearly 30 minutes with [place] Child Protective [employer].  Report made to Marquita Rainer/CPS intake. 

## 2015-05-15 NOTE — Progress Notes (Signed)
Was not able to connect with patient in her room, but found her bedside in the NICU.  Introduced myself and spiritual care services.  Pt states her mother is a support and that's all she needs because she is "not a people person."  Explained that we will continue to support pt and baby during the stay in the NICU and she appreciated visits to baby stating, she is a lover.  Lysle Dingwall Lomax     05/15/15 1200  Clinical Encounter Type  Visited With Patient and family together  Visit Type Initial  Referral From Chaplain;Social work

## 2015-05-15 NOTE — Lactation Note (Signed)
This note was copied from the chart of Yolanda Baird. Lactation Consultation Note  This mom plans to formula feed.  Patient Name: Yolanda Merdis Snodgrass [id number] Date: 05/15/2015     Maternal Data    Feeding    LATCH Score/Interventions                      Lactation Tools Discussed/Used     Consult Status      Soyla Dryer 05/15/2015, 2:21 PM

## 2015-05-16 MED ORDER — IBUPROFEN 600 MG PO TABS: 600.0000 mg | ORAL_TABLET | Freq: Four times a day (QID) | ORAL | Status: DC

## 2015-05-16 NOTE — Discharge Instructions (Signed)

## 2015-05-16 NOTE — Progress Notes (Signed)
CSW received numerous phone calls from [facility] in order to inquire about status of CPS report that was made on 9/15.   CSW met with MOB in her room, and informed MOB that no additional information is known at this time. MOB became tearful, and continued to process with CSW her feelings secondary to the CPS report. She voiced fears that she will be unable to parent the infant, and expressed belief that she is "not ready and able to handle" an investigation with CPS. CSW acknowledged, validated, and normalized her feelings, and she continued to be highly tearful. Due to emotional intensity, CSW noted that it was difficult to engage in cognitive techniques to assist her to self-regulate. CSW attempted to assist the MOB identify evidence for and against her ability to take the infant at home with her, or to explore strengths and/or positive changes that she has engaged in since custody of her first child was removed from her. MOB was unable to engage in these exercises, and repeated "I'm ready for the baby", "I have everything I need". CSW praised MOB's efforts to prepare for the infant.   CSW contacted CPS and was informed that the report has been accepted and assigned A.London Pepper [phone]). CSW spoke with CPS worker to provide update on MOB's anxiety and presentation. CPS reported intention to meet with the MOB today and to arrive to the hospital in order to "see" the infant.   CSW followed up with MOB while she was visiting at infant's bedside. MOB was observed to be holding and interacting with the infant, and she was noted to be smiling when she introduced CSW to the infant. CSW provided update, and MOB became tearful. MOB acknowledged that CPS will be contacting her, and she voiced an awareness that despite her not having any desire to meet with them, that she needs to engage and interact with them in order to express her personal strengths and readiness to care for the infant.   MOB  agreeable to ongoing CSW interventions and support, and agreed to contact CSW as needs arise.

## 2015-05-16 NOTE — Discharge Summary (Signed)
Physician Discharge Summary  Patient ID: Yolanda Baird MRN: [id number] DOB/AGE: Apr 03, 1991 24 y.o.  Admit date: 05/11/2015 Discharge date: 05/16/2015  Admission Diagnoses: PPROM  Discharge Diagnoses:  Principal Problem:   NSVD (normal spontaneous vaginal delivery)  Discharged Condition: good  Hospital Course:   Yolanda Baird is a 24 y.o. [id number] s/p SVD.  Patient was admitted for PPROM @ 32.1wks. Latency abx (ampicillin and azithromycin) were started, as well as magnesium for neuroprotection. She developed uterine tenderness while on the antenatal service, and was induced for triple I. She was continued on ampicillin but started on gentamicin as well. After delivery, she required manual extraction of placental fragments, with EBL of 500. She did not have excessive bleeding after this initial hemorrhage. She has postpartum course that was uncomplicated including no problems with ambulating, PO intake, urination, pain, or bleeding. The pt feels ready to go home and  will be discharged with outpatient follow-up.   Delivery Note At 2:55 PM a viable female was delivered via vaginal delivery (Presentation: occiput; anterior). APGAR: 9, 7; weight 4 lb 5.1 oz (1960 g).  Placenta status: spontaneous; placental remnant was manually retrieved from uterus. 1000 mcg cytotec was subsequently administered rectally. Placenta was sent to pathology. Cord: 3 vessels with the following complications: none.   Anesthesia: Epidural  Episiotomy: None Lacerations: None Est. Blood Loss (mL): 500  Mom to postpartum. Baby to NICU.  Today: No acute events overnight.  Pt denies problems with ambulating, voiding or po intake.  She denies nausea or vomiting.  Pain is moderately controlled.  She has had flatus. She has had bowel movement.  Lochia Small.  Plan for birth control is  undecided.  Method of Feeding: Bottle.   Discharge Exam: Blood pressure 109/63, pulse 77, temperature 98.3 F (36.8 C),  temperature source Oral, resp. rate 18, height 5\' 7"  (1.702 m), weight 199 lb (90.266 kg), last menstrual period 10/07/2014, SpO2 97 %, unknown if currently breastfeeding.  Physical Exam:  General: alert, cooperative and no distress Lochia: appropriate Uterine Fundus: firm DVT Evaluation: No evidence of DVT seen on physical exam.  Disposition: 01-Home or Self Care      Discharge Instructions    Activity as tolerated    Complete by:  As directed      Call MD for:  difficulty breathing, headache or visual disturbances    Complete by:  As directed      Call MD for:  extreme fatigue    Complete by:  As directed      Call MD for:  hives    Complete by:  As directed      Call MD for:  persistant dizziness or light-headedness    Complete by:  As directed      Call MD for:  persistant nausea and vomiting    Complete by:  As directed      Call MD for:  severe uncontrolled pain    Complete by:  As directed      Driving restriction     Complete by:  As directed   Avoid driving for at least 2 weeks.     Lifting restrictions    Complete by:  As directed   Weight restriction of 15 lbs.     Sexual acrtivity    Complete by:  As directed   Pelvic rest (no tampons or sex) for six weeks.            Medication List    TAKE these medications  ibuprofen 600 MG tablet  Commonly known as:  ADVIL,MOTRIN  Take 1 tablet (600 mg total) by mouth every 6 (six) hours.     PRENATAL FORMULA PO  Take 2 tablets by mouth at bedtime.       Follow-up Information    Follow up with [facility]. Schedule an appointment as soon as possible for a visit in 6 weeks.   Why:  For hospital follow-up   Contact information:   [address] [place] [address] [phone]      Signed: Tarri Abernethy, MD PGY-1 [facility] 05/16/2015, 7:44 AM   CNM attestation I have seen and examined this patient and agree with above documentation in the  resident's note.   Yolanda Baird is a 24 y.o. [id number] s/p preterm SVD after PPROM.   Pain is well controlled.  Plan for birth control is undecided.  Method of Feeding: bottle  PE:  BP 109/63 mmHg  Pulse 77  Temp(Src) 98.3 F (36.8 C) (Oral)  Resp 18  Ht  (1.702 m)  Wt 90.266 kg (199 lb)  BMI 31.16 kg/m2  SpO2 97%  LMP 10/07/2014  Breastfeeding? Unknown Fundus firm  No results for input(s): HGB, HCT in the last 72 hours.   Plan: discharge today - postpartum care discussed - f/u clinic in 6 weeks for postpartum visit   Cam Hai, CNM 7:38 PM 05/16/2015

## 2015-05-16 NOTE — Progress Notes (Signed)
Pt ambulated out teaching complete  

## 2015-05-18 ENCOUNTER — Ambulatory Visit: Payer: Self-pay

## 2015-05-18 NOTE — Lactation Note (Signed)
This note was copied from the chart of Yolanda Chrisette Man. Lactation Consultation Note  Initial Consult with mom at request of NICU nurse. Mom is a G2 who was not planning to BF. Infant is preterm and is in NICU. Baby is 4 do and mom is very engorged today. She is also complaining of nipple pain. NICU Nurse has been having mom ice her breast and pumping with a hand pump. Mom has chosen to now begin pumping for her infant as her nurse told them that her body makes milk especially for her preterm infant. Mom has iced breasts and pumped 4 oz x 2 with hand pump today. Mom is a WIC pt and is unable to provide cash for [employer] today. Set her up with DEBP kit to pump while here visiting baby in NICU and will use hand pump at home until able to get DEBP. Mom has [facility] appt 9/20. Will send [facility] referral. Comfort gels given as nipples are sore from pumping. Breasts are firm and red, but mom reports they are much better after pumping. Enc her to pump q 2-3 hours for 15 minutes on Preemie setting and to bring milk to NICU for baby. Gave her Providing Milk for your Baby in NICU booklet. Told her to call when here to morrow so we can rent her a DEBP.    Patient Name: Yolanda Baird [id number] Date: 05/18/2015 Reason for consult: Initial assessment;Infant < 6lbs;NICU baby   Maternal Data Does the patient have breastfeeding experience prior to this delivery?: No  Feeding    LATCH Score/Interventions                      Lactation Tools Discussed/Used WIC Program: Yes Pump Review: Setup, frequency, and cleaning;Milk Storage Initiated by:: Nonah Mattes, RN, IBCLC Date initiated:: 05/18/15   Consult Status Consult Status: PRN Follow-up type: Call as needed    Donn Pierini 05/18/2015, 4:36 PM

## 2015-05-19 ENCOUNTER — Ambulatory Visit: Payer: Self-pay

## 2015-05-19 ENCOUNTER — Encounter: Payer: Medicaid Other | Admitting: Family Medicine

## 2015-05-19 NOTE — Lactation Note (Signed)
This note was copied from the chart of Yolanda Baird. Lactation Consultation Note  Patient Name: Yolanda Baird Date: 05/19/2015 Reason for consult: Follow-up assessment  NICU baby 5 days of life. Asked to see mom about a [facility] loaner, but mom states that she doesn't have the money to get a pump. Mom does have an appointment with [place], she thinks on 05-20-15--WIC is supposed to call and confirm. Mom states that she is in the NICU most of the day and is able to use the hospital pump. Enc mom to use every 2-3 hour. Also discussed using the piston in the kit in order to double-pump. Also offered to give a second hand pump but mom declined. Enc mom to call for assistance as needed.   Maternal Data    Feeding Feeding Type: Formula Length of feed: 30 min  LATCH Score/Interventions                      Lactation Tools Discussed/Used     Consult Status Consult Status: PRN    [name] 05/19/2015, 12:39 PM

## 2015-05-21 ENCOUNTER — Ambulatory Visit: Payer: Self-pay

## 2015-05-21 NOTE — Lactation Note (Addendum)
This note was copied from the chart of Yolanda Baird. Lactation Consultation Note  Patient Name: Yolanda Baird [id number] Date: 05/21/2015   NICU baby 7 days of life. Baby's NICU RN transferred mom to this LC's phone to discuss questions about medication. Mom had questions about taking a 1-time dose of "Unisom" last night, and then wanting to know if the "Red Bull" she is drinking is compatible with providing breast milk. Discussed with mom that Unisom is an L3 medication which can make baby sleepy, and Red Bull is highly concentrated with caffeine which should be avoided while providing milk for baby. Discussed with mom that this is not compatible with breast milk. Discussed with mom that the baby's care providers should know about her consumption of these items if she intends to continue providing EBM. Discussed conversation and recommendations with [name], [name].   Maternal Data    Feeding Feeding Type: Bottle Fed - Breast Milk Length of feed: 30 min  LATCH Score/Interventions                      Lactation Tools Discussed/Used     Consult Status      Yolanda Baird 05/21/2015, 2:24 PM

## 2015-05-23 ENCOUNTER — Ambulatory Visit: Payer: Self-pay

## 2015-05-23 NOTE — Lactation Note (Signed)
This note was copied from the chart of Yolanda Baird. Lactation Consultation Note  Patient Name: Yolanda Latima Hamza [id number] Date: 05/23/2015 Reason for consult: Follow-up assessment;NICU baby NICU baby 57 days old. Mom complains of sore nipples. Assessed mom while using DEBP and #27 flanges. Fitted mom with #24 flanges and mom reports increased comfort. Enc mom to use thin layer of olive oil either on flanges or nipples as needed as well to prevent friction (mom is allergic to coconut oil). Enc mom to call for assistance as needed.   Maternal Data    Feeding Feeding Type: Breast Milk with Formula added Length of feed: 30 min  LATCH Score/Interventions                      Lactation Tools Discussed/Used     Consult Status Consult Status: PRN    Geralynn Ochs 05/23/2015, 3:30 PM

## 2015-06-03 ENCOUNTER — Ambulatory Visit: Payer: Self-pay

## 2015-06-03 NOTE — Lactation Note (Signed)
This note was copied from the chart of Yolanda Baird. Lactation Consultation Note  Patient Name: Yolanda Baird [id number] Date: 06/03/2015 Reason for consult: Follow-up assessment;NICU baby NICU baby 4 weeks old. Mom roomed in with baby last night and states that she cannot wait to leave and go home. Mom reports that she is going to continue pumping and bottle-feeding EBM. Mom states that she tried to nurse, but is not comfortable with it. Mom states that she thinks her supply is a little low, and that she is pumping every "4 or so" hours. Enc mom to pump after each feeding. Mom states that she has not tried Fenugreek yet. Discussed that pumping more important, and Fenugreek secondary. Mom aware of OP/BFSG and LC phone line assistance after D/C.   Maternal Data    Feeding    LATCH Score/Interventions                      Lactation Tools Discussed/Used     Consult Status Consult Status: Complete    Annemarie Sebree 06/03/2015, 11:16 AM

## 2015-06-05 ENCOUNTER — Ambulatory Visit (HOSPITAL_COMMUNITY): Admission: RE | Admit: 2015-06-05 | Payer: Medicaid Other | Source: Ambulatory Visit

## 2015-06-26 ENCOUNTER — Encounter: Payer: Self-pay | Admitting: Obstetrics & Gynecology

## 2015-06-26 ENCOUNTER — Ambulatory Visit (INDEPENDENT_AMBULATORY_CARE_PROVIDER_SITE_OTHER): Payer: Medicaid Other | Admitting: Obstetrics & Gynecology

## 2015-06-26 DIAGNOSIS — Z30017 Encounter for initial prescription of implantable subdermal contraceptive: Secondary | ICD-10-CM | POA: Diagnosis not present

## 2015-06-26 DIAGNOSIS — Z3202 Encounter for pregnancy test, result negative: Secondary | ICD-10-CM

## 2015-06-26 LAB — POCT PREGNANCY, URINE: Preg Test, Ur: NEGATIVE

## 2015-06-26 MED ORDER — ETONOGESTREL 68 MG ~~LOC~~ IMPL
68.0000 mg | DRUG_IMPLANT | Freq: Once | SUBCUTANEOUS | Status: AC
  Administered 2015-06-26: 68 mg via SUBCUTANEOUS

## 2015-06-26 NOTE — Patient Instructions (Signed)
Etonogestrel implant What is this medicine? ETONOGESTREL (et oh noe JES trel) is a contraceptive (birth control) device. It is used to prevent pregnancy. It can be used for up to 3 years. This medicine may be used for other purposes; ask your health care provider or pharmacist if you have questions. What should I tell my health care provider before I take this medicine? They need to know if you have any of these conditions: -abnormal vaginal bleeding -blood vessel disease or blood clots -cancer of the breast, cervix, or liver -depression -diabetes -gallbladder disease -headaches -heart disease or recent heart attack -high blood pressure -high cholesterol -kidney disease -liver disease -renal disease -seizures -tobacco smoker -an unusual or allergic reaction to etonogestrel, other hormones, anesthetics or antiseptics, medicines, foods, dyes, or preservatives -pregnant or trying to get pregnant -breast-feeding How should I use this medicine? This device is inserted just under the skin on the inner side of your upper arm by a health care professional. Talk to your pediatrician regarding the use of this medicine in children. Special care may be needed. Overdosage: If you think you have taken too much of this medicine contact a poison control center or emergency room at once. NOTE: This medicine is only for you. Do not share this medicine with others. What if I miss a dose? This does not apply. What may interact with this medicine? Do not take this medicine with any of the following medications: -amprenavir -bosentan -fosamprenavir This medicine may also interact with the following medications: -barbiturate medicines for inducing sleep or treating seizures -certain medicines for fungal infections like ketoconazole and itraconazole -griseofulvin -medicines to treat seizures like carbamazepine, felbamate, oxcarbazepine, phenytoin,  topiramate -modafinil -phenylbutazone -rifampin -some medicines to treat HIV infection like atazanavir, indinavir, lopinavir, nelfinavir, tipranavir, ritonavir -St. John's wort This list may not describe all possible interactions. Give your health care provider a list of all the medicines, herbs, non-prescription drugs, or dietary supplements you use. Also tell them if you smoke, drink alcohol, or use illegal drugs. Some items may interact with your medicine. What should I watch for while using this medicine? This product does not protect you against HIV infection (AIDS) or other sexually transmitted diseases. You should be able to feel the implant by pressing your fingertips over the skin where it was inserted. Contact your doctor if you cannot feel the implant, and use a non-hormonal birth control method (such as condoms) until your doctor confirms that the implant is in place. If you feel that the implant may have broken or become bent while in your arm, contact your healthcare provider. What side effects may I notice from receiving this medicine? Side effects that you should report to your doctor or health care professional as soon as possible: -allergic reactions like skin rash, itching or hives, swelling of the face, lips, or tongue -breast lumps -changes in emotions or moods -depressed mood -heavy or prolonged menstrual bleeding -pain, irritation, swelling, or bruising at the insertion site -scar at site of insertion -signs of infection at the insertion site such as fever, and skin redness, pain or discharge -signs of pregnancy -signs and symptoms of a blood clot such as breathing problems; changes in vision; chest pain; severe, sudden headache; pain, swelling, warmth in the leg; trouble speaking; sudden numbness or weakness of the face, arm or leg -signs and symptoms of liver injury like dark yellow or brown urine; general ill feeling or flu-like symptoms; light-colored stools; loss of  appetite; nausea; right upper belly   pain; unusually weak or tired; yellowing of the eyes or skin -unusual vaginal bleeding, discharge -signs and symptoms of a stroke like changes in vision; confusion; trouble speaking or understanding; severe headaches; sudden numbness or weakness of the face, arm or leg; trouble walking; dizziness; loss of balance or coordination Side effects that usually do not require medical attention (Report these to your doctor or health care professional if they continue or are bothersome.): -acne -back pain -breast pain -changes in weight -dizziness -general ill feeling or flu-like symptoms -headache -irregular menstrual bleeding -nausea -sore throat -vaginal irritation or inflammation This list may not describe all possible side effects. Call your doctor for medical advice about side effects. You may report side effects to FDA at 1-800-FDA-1088. Where should I keep my medicine? This drug is given in a hospital or clinic and will not be stored at home. NOTE: This sheet is a summary. It may not cover all possible information. If you have questions about this medicine, talk to your doctor, pharmacist, or health care provider.    2016, Elsevier/Gold Standard. (2014-05-31 14:07:06)  

## 2015-06-26 NOTE — Progress Notes (Signed)
Patient ID: Yolanda OharaBriana Medinger, female   DOB: September 25, 1990, 24 y.o.   MRN: [id number] Subjective:requests Nexplanon, has had before pregnancy     Yolanda OharaBriana Sulkowski is a 24 y.o. female who presents for a postpartum visit. She is 6 weeks postpartum following a spontaneous vaginal delivery. I have fully reviewed the prenatal and intrapartum course. The delivery was at 32 gestational weeks. Outcome: spontaneous vaginal delivery. Anesthesia: epidural. Postpartum course has been normal. Baby's course has been good. Baby is feeding by bottle - Similac Neosure. Bleeding no bleeding. Bowel function is normal. Bladder function is normal. Patient is not sexually active. Contraception method is Nexplanon. Postpartum depression screening: negative.  The following portions of the patient's history were reviewed and updated as appropriate: allergies, current medications, past family history, past medical history, past social history, past surgical history and problem list.  Review of Systems Pertinent items are noted in HPI.   Objective:    BP 119/80 mmHg  Pulse 75  Temp(Src) 98.2 F (36.8 C)  Ht 5\' 6"  (1.676 m)  Wt 190 lb (86.183 kg)  BMI 30.68 kg/m2  Breastfeeding? No  General:  alert, cooperative and no distress   Breasts:           Abdomen: soft, non-tender; bowel sounds normal; no masses,  no organomegaly   Vulva:  not evaluated  Vagina: not evaluated                   Patient given informed consent, signed copy in the chart, time out was performed. Pregnancy test was negative Appropriate time out taken.  Patient's left arm was prepped and draped in the usual sterile fashion.. The ruler used to measure and mark insertion area.  Pt was prepped with alcohol swab and then injected with 3 cc of 1 % lidocaine.  Pt was prepped with betadine, Nexplanon removed form packaging. Then inserted per standard guidelines. Patient and provider were able to palpate rod under skin. Pt insertion site covered with sterile  dressing.   Minimal blood loss.  Pt tolerated the procedure well.  Assessment:     nomal postpartum exam. Pap smear not done at today's visit.  Nexplanon inserted Plan:    1. Contraception: Nexplanon 2. Inserted today 3. Follow up in: 6 months or as needed.  [place]  Adam PhenixJames G Arnold, MD 06/26/2015

## 2015-10-21 ENCOUNTER — Encounter: Payer: Self-pay | Admitting: *Deleted

## 2017-04-14 ENCOUNTER — Encounter (HOSPITAL_COMMUNITY): Payer: Self-pay | Admitting: Emergency Medicine

## 2017-04-14 DIAGNOSIS — R350 Frequency of micturition: Secondary | ICD-10-CM | POA: Diagnosis not present

## 2017-04-14 DIAGNOSIS — N3001 Acute cystitis with hematuria: Secondary | ICD-10-CM | POA: Insufficient documentation

## 2017-04-14 DIAGNOSIS — R109 Unspecified abdominal pain: Secondary | ICD-10-CM | POA: Diagnosis present

## 2017-04-14 DIAGNOSIS — R3915 Urgency of urination: Secondary | ICD-10-CM | POA: Insufficient documentation

## 2017-04-14 DIAGNOSIS — Z7982 Long term (current) use of aspirin: Secondary | ICD-10-CM | POA: Insufficient documentation

## 2017-04-14 DIAGNOSIS — F17210 Nicotine dependence, cigarettes, uncomplicated: Secondary | ICD-10-CM | POA: Insufficient documentation

## 2017-04-14 LAB — CBC
HCT: 40.4 % (ref 36.0–46.0)
Hemoglobin: 13.9 g/dL (ref 12.0–15.0)
MCH: 29.8 pg (ref 26.0–34.0)
MCHC: 34.4 g/dL (ref 30.0–36.0)
MCV: 86.5 fL (ref 78.0–100.0)
PLATELETS: 259 10*3/uL (ref 150–400)
RBC: 4.67 MIL/uL (ref 3.87–5.11)
RDW: 12.8 % (ref 11.5–15.5)
WBC: 12.1 10*3/uL — AB (ref 4.0–10.5)

## 2017-04-14 LAB — COMPREHENSIVE METABOLIC PANEL
ALK PHOS: 127 U/L — AB (ref 38–126)
ALT: 187 U/L — AB (ref 14–54)
AST: 89 U/L — ABNORMAL HIGH (ref 15–41)
Albumin: 4 g/dL (ref 3.5–5.0)
Anion gap: 9 (ref 5–15)
BILIRUBIN TOTAL: 0.8 mg/dL (ref 0.3–1.2)
BUN: 7 mg/dL (ref 6–20)
CALCIUM: 9 mg/dL (ref 8.9–10.3)
CO2: 25 mmol/L (ref 22–32)
CREATININE: 0.83 mg/dL (ref 0.44–1.00)
Chloride: 102 mmol/L (ref 101–111)
Glucose, Bld: 89 mg/dL (ref 65–99)
Potassium: 3.9 mmol/L (ref 3.5–5.1)
Sodium: 136 mmol/L (ref 135–145)
Total Protein: 7.3 g/dL (ref 6.5–8.1)

## 2017-04-14 LAB — I-STAT BETA HCG BLOOD, ED (MC, WL, AP ONLY): I-stat hCG, quantitative: <5 m[IU]/mL (ref <5)

## 2017-04-14 LAB — URINALYSIS, ROUTINE W REFLEX MICROSCOPIC
BILIRUBIN URINE: NEGATIVE
GLUCOSE, UA: NEGATIVE mg/dL
Hgb urine dipstick: NEGATIVE
KETONES UR: NEGATIVE mg/dL
Nitrite: POSITIVE — AB
PH: 6 (ref 5.0–8.0)
Protein, ur: 30 mg/dL — AB
SPECIFIC GRAVITY, URINE: 1.017 (ref 1.005–1.030)

## 2017-04-14 LAB — LIPASE, BLOOD: Lipase: 42 U/L (ref 11–51)

## 2017-04-14 NOTE — ED Triage Notes (Signed)
Pt c/o lower abdominal pain that began 3 days ago. Pt states she has urinary frequency, urine has a strong odor, and is dark. Denies vaginal discharge.

## 2017-04-15 ENCOUNTER — Emergency Department (HOSPITAL_COMMUNITY): Payer: BLUE CROSS/BLUE SHIELD

## 2017-04-15 ENCOUNTER — Emergency Department (HOSPITAL_COMMUNITY)
Admission: EM | Admit: 2017-04-15 | Discharge: 2017-04-15 | Disposition: A | Discharge status: Home / Self Care | Payer: BLUE CROSS/BLUE SHIELD | Attending: Emergency Medicine | Admitting: Emergency Medicine

## 2017-04-15 DIAGNOSIS — R109 Unspecified abdominal pain: Secondary | ICD-10-CM

## 2017-04-15 DIAGNOSIS — N390 Urinary tract infection, site not specified: Secondary | ICD-10-CM

## 2017-04-15 DIAGNOSIS — N3001 Acute cystitis with hematuria: Secondary | ICD-10-CM | POA: Diagnosis not present

## 2017-04-15 DIAGNOSIS — R319 Hematuria, unspecified: Secondary | ICD-10-CM

## 2017-04-15 MED ORDER — CEPHALEXIN 500 MG PO CAPS: 500.0000 mg | ORAL_CAPSULE | Freq: Two times a day (BID) | ORAL | 0 refills | Status: DC

## 2017-04-15 MED ORDER — MORPHINE SULFATE (PF) 4 MG/ML IV SOLN
4.0000 mg | Freq: Once | INTRAVENOUS | Status: AC
  Administered 2017-04-15: 4 mg via INTRAVENOUS
  Filled 2017-04-15: qty 1

## 2017-04-15 MED ORDER — DEXTROSE 5 % IV SOLN
1.0000 g | Freq: Once | INTRAVENOUS | Status: AC
  Administered 2017-04-15: 1 g via INTRAVENOUS
  Filled 2017-04-15: qty 10

## 2017-04-15 MED ORDER — ONDANSETRON HCL 4 MG/2ML IJ SOLN
4.0000 mg | Freq: Once | INTRAMUSCULAR | Status: AC
  Administered 2017-04-15: 4 mg via INTRAVENOUS
  Filled 2017-04-15: qty 2

## 2017-04-15 NOTE — ED Provider Notes (Signed)
[facility] Provider Note   CSN: [id number] Arrival date & time: 04/14/17  1944     History   Chief Complaint Chief Complaint  Patient presents with  . Abdominal Pain    HPI Yolanda Baird is a 26 y.o. female.  Patient presents to the emergency department with chief complaint of flank pain. She states that the symptoms started about 3 days ago. She reports some urinary frequency and urgency and notes that her urine has been dark. She denies any pain with urination. She denies any fevers, chills, nausea, or vomiting. She states that the pain comes in waves, and is severe at its peak. She denies any history of kidney stones. She has not taken any in for her symptoms. There are no other associated symptoms. There are no modifying factors.   The history is provided by the patient. No language interpreter was used.    Past Medical History:  Diagnosis Date  . Anemia   . Anxiety   . Bipolar 1 disorder (HCC)   . Bipolar disorder (HCC)   . Depression   . GERD (gastroesophageal reflux disease)   . Headache     Patient Active Problem List   Diagnosis Date Noted  . Tobacco use during pregnancy 04/21/2015  . Bipolar disorder (HCC) 04/07/2015  . History of stillbirth 04/07/2015    Past Surgical History:  Procedure Laterality Date  . TYMPANOSTOMY TUBE PLACEMENT      OB History    Gravida Para Term Preterm AB Living   5 3 1 2 2 2    SAB TAB Ectopic Multiple Live Births   2 0 0 0 2       Home Medications    Prior to Admission medications   Medication Sig Start Date End Date Taking? Authorizing Provider  aspirin 325 MG tablet Take 975 mg by mouth every 6 (six) hours as needed.    [provider]    Family History Family History  Problem Relation Age of Onset  . Diabetes Maternal Grandmother     Social History Social History  Substance Use Topics  . Smoking status: Current Every Day Smoker    Packs/day: 0.50    Types: Cigarettes  . Smokeless  tobacco: Never Used  . Alcohol use No     Allergies   Patient has no known allergies.   Review of Systems Review of Systems  All other systems reviewed and are negative.    Physical Exam Updated Vital Signs BP (!) 142/85 (BP Location: Right Arm)   Pulse 100   Temp 98.4 F (36.9 C) (Oral)   Resp 18   Ht 5\' 6"  (1.676 m)   SpO2 95%   Physical Exam  Constitutional: She is oriented to person, place, and time. She appears well-developed and well-nourished.  HENT:  Head: Normocephalic and atraumatic.  Eyes: Pupils are equal, round, and reactive to light. Conjunctivae and EOM are normal.  Neck: Normal range of motion. Neck supple.  Cardiovascular: Normal rate and regular rhythm.  Exam reveals no gallop and no friction rub.   No murmur heard. Pulmonary/Chest: Effort normal and breath sounds normal. No respiratory distress. She has no wheezes. She has no rales. She exhibits no tenderness.  Abdominal: Soft. Bowel sounds are normal. She exhibits no distension and no mass. There is no tenderness. There is no rebound and no guarding.  Musculoskeletal: Normal range of motion. She exhibits no edema or tenderness.  Neurological: She is alert and oriented to person, place, and  time.  Skin: Skin is warm and dry.  Psychiatric: She has a normal mood and affect. Her behavior is normal. Judgment and thought content normal.  Nursing note and vitals reviewed.    ED Treatments / Results  Labs (all labs ordered are listed, but only abnormal results are displayed) Labs Reviewed  COMPREHENSIVE METABOLIC PANEL - Abnormal; Notable for the following:       Result Value   AST 89 (*)    ALT 187 (*)    Alkaline Phosphatase 127 (*)    All other components within normal limits  CBC - Abnormal; Notable for the following:    WBC 12.1 (*)    All other components within normal limits  URINALYSIS, ROUTINE W REFLEX MICROSCOPIC - Abnormal; Notable for the following:    APPearance HAZY (*)    Protein,  ur 30 (*)    Nitrite POSITIVE (*)    Leukocytes, UA SMALL (*)    Bacteria, UA FEW (*)    Squamous Epithelial / LPF 0-5 (*)    All other components within normal limits  URINE CULTURE  LIPASE, BLOOD  I-STAT BETA HCG BLOOD, ED ([facility], [facility], [facility] ONLY)    EKG  EKG Interpretation None       Radiology Ct Renal Stone Study  Result Date: 04/15/2017 CLINICAL DATA:  Right flank pain for 3 days EXAM: CT ABDOMEN AND PELVIS WITHOUT CONTRAST TECHNIQUE: Multidetector CT imaging of the abdomen and pelvis was performed following the standard protocol without IV contrast. COMPARISON:  None. FINDINGS: Lower chest: 4 mm faint pulmonary nodule in the subpleural right lower lobe. No pleural effusion or consolidation. Normal heart size. Hepatobiliary: No focal hepatic abnormality. Gallbladder poorly identified, suspect that it is very contracted. No biliary dilatation Pancreas: Unremarkable. No pancreatic ductal dilatation or surrounding inflammatory changes. Spleen: Normal in size without focal abnormality. Adrenals/Urinary Tract: Adrenal glands are unremarkable. Kidneys are normal, without renal calculi, focal lesion, or hydronephrosis. Bladder is unremarkable. Stomach/Bowel: Stomach is within normal limits. Appendix appears normal. No evidence of bowel wall thickening, distention, or inflammatory changes. Vascular/Lymphatic: No significant vascular findings are present. No enlarged abdominal or pelvic lymph nodes. Reproductive: Uterus and bilateral adnexa are unremarkable. 2.5 cm cyst right ovary. Other: Negative for free air or free fluid. Musculoskeletal: No acute or significant osseous findings. IMPRESSION: 1. Negative for hydronephrosis or ureteral stone 2. Negative appendix 3. No definite CT evidence for acute intra-abdominal or pelvic pathology Electronically Signed   By: Jasmine Pang M.D.   On: 04/15/2017 01:44    Procedures Procedures (including critical care time)  Medications Ordered in ED Medications    morphine 4 MG/ML injection 4 mg (not administered)  ondansetron (ZOFRAN) injection 4 mg (not administered)     Initial Impression / Assessment and Plan / ED Course  I have reviewed the triage vital signs and the nursing notes.  Pertinent labs & imaging results that were available during my care of the patient were reviewed by me and considered in my medical decision making (see chart for details).     Patient with flank pain and hematuria. Pain comes in waves, and is severe. Concern for kidney stone. Will check CT renal study. Pregnancy test negative. Will treat pain, and will reassess.  CT shows no evidence of renal stone.  Symptoms seem consistent with early pyelonephritis. Patient given IV antibiotics and deep. She is tolerating oral intake. Will discharge to home with antibiotics and outpatient follow-up. Return precautions discussed. Patient understands and agrees to  plan. She is stable and ready for discharge.   Final Clinical Impressions(s) / ED Diagnoses   Final diagnoses:  Flank pain  Urinary tract infection with hematuria, site unspecified    New Prescriptions New Prescriptions   No medications on file     Roxy Horseman, Cordelia Poche 04/15/17 0414    Loren Racer, MD 04/22/17 1455

## 2017-04-17 LAB — URINE CULTURE

## 2017-04-18 ENCOUNTER — Telehealth: Payer: Self-pay | Admitting: Emergency Medicine

## 2017-04-18 NOTE — Telephone Encounter (Signed)
Post ED Visit - Positive Culture Follow-up  Culture report reviewed by antimicrobial stewardship pharmacist:  []  Enzo Bi, Pharm.D. []  Celedonio Miyamoto, [address].D., BCPS AQ-ID []  Garvin Fila, Pharm.D., BCPS []  Georgina Pillion, [address].D., BCPS []  [place], [address].D., BCPS, AAHIVP []  Estella Husk, Pharm.D., BCPS, AAHIVP []  Lysle Pearl, PharmD, BCPS []  Casilda Carls, PharmD, BCPS []  Pollyann Samples, PharmD, BCPS Sharin Mons Pharm D  Positive urine culture Treated with cephalexin, organism sensitive to the same and no further patient follow-up is required at this time.  Berle Mull 04/18/2017, 9:36 AM

## 2017-05-14 ENCOUNTER — Encounter (HOSPITAL_COMMUNITY): Payer: Self-pay | Admitting: Emergency Medicine

## 2017-05-14 ENCOUNTER — Emergency Department (HOSPITAL_COMMUNITY)
Admission: EM | Admit: 2017-05-14 | Discharge: 2017-05-14 | Disposition: A | Discharge status: Home / Self Care | Payer: BLUE CROSS/BLUE SHIELD | Attending: Emergency Medicine | Admitting: Emergency Medicine

## 2017-05-14 DIAGNOSIS — Z79899 Other long term (current) drug therapy: Secondary | ICD-10-CM | POA: Insufficient documentation

## 2017-05-14 DIAGNOSIS — Z7982 Long term (current) use of aspirin: Secondary | ICD-10-CM | POA: Diagnosis not present

## 2017-05-14 DIAGNOSIS — J029 Acute pharyngitis, unspecified: Secondary | ICD-10-CM | POA: Diagnosis not present

## 2017-05-14 DIAGNOSIS — F17210 Nicotine dependence, cigarettes, uncomplicated: Secondary | ICD-10-CM | POA: Insufficient documentation

## 2017-05-14 LAB — RAPID STREP SCREEN (MED CTR MEBANE ONLY): Streptococcus, Group A Screen (Direct): NEGATIVE

## 2017-05-14 MED ORDER — DEXAMETHASONE SODIUM PHOSPHATE 10 MG/ML IJ SOLN
10.0000 mg | Freq: Once | INTRAMUSCULAR | Status: AC
  Administered 2017-05-14: 10 mg via INTRAMUSCULAR
  Filled 2017-05-14: qty 1

## 2017-05-14 MED ORDER — PENICILLIN G BENZATHINE & PROC 1200000 UNIT/2ML IM SUSP
1.2000 10*6.[IU] | Freq: Once | INTRAMUSCULAR | Status: AC
  Administered 2017-05-14: 1.2 10*6.[IU] via INTRAMUSCULAR
  Filled 2017-05-14: qty 2

## 2017-05-14 NOTE — ED Triage Notes (Signed)
Pt states since yesterday she is been having sore throat and swollen that she feels she can hardly swallow.

## 2017-05-14 NOTE — ED Provider Notes (Signed)
[facility] Provider Note   CSN: [id number] Arrival date & time: 05/14/17  1959     History   Chief Complaint Chief Complaint  Patient presents with  . Sore Throat    HPI Yolanda Baird is a 26 y.o. female who presents today for evaluation of sore throat.  She reports it has been Present for 2 days. It has been gradually worsening. She has found mild relief with T. No nausea, vomiting, or diarrhea. She says that she does not have any known sick contacts.  She says it hurts to swallow but is still able to.    HPI  Past Medical History:  Diagnosis Date  . Anemia   . Anxiety   . Bipolar 1 disorder (HCC)   . Bipolar disorder (HCC)   . Depression   . GERD (gastroesophageal reflux disease)   . Headache     Patient Active Problem List   Diagnosis Date Noted  . Tobacco use during pregnancy 04/21/2015  . Bipolar disorder (HCC) 04/07/2015  . History of stillbirth 04/07/2015    Past Surgical History:  Procedure Laterality Date  . TYMPANOSTOMY TUBE PLACEMENT      OB History    Gravida Para Term Preterm AB Living   SAB TAB Ectopic Multiple Live Births   2 0 0 0 2       Home Medications    Prior to Admission medications   Medication Sig Start Date End Date Taking? Authorizing Provider  aspirin 325 MG tablet Take 975 mg by mouth every 6 (six) hours as needed.    [provider]  cephALEXin (KEFLEX) 500 MG capsule Take 1 capsule (500 mg total) by mouth 2 (two) times daily. 04/15/17   Roxy Horseman, PA-C    Family History Family History  Problem Relation Age of Onset  . Diabetes Maternal Grandmother     Social History Social History  Substance Use Topics  . Smoking status: Current Every Day Smoker    Packs/day: 0.50    Types: Cigarettes  . Smokeless tobacco: Never Used  . Alcohol use No     Allergies   Patient has no known allergies.   Review of Systems Review of Systems  Constitutional: Negative for chills and fever.    HENT: Positive for congestion, sore throat and voice change. Negative for drooling, ear discharge, ear pain, facial swelling, postnasal drip, rhinorrhea, sinus pain, sinus pressure, sneezing and trouble swallowing.   Respiratory: Negative for cough and shortness of breath.   Gastrointestinal: Negative for abdominal pain, nausea and vomiting.     Physical Exam Updated Vital Signs BP 126/79 (BP Location: Right Arm)   Pulse 88   Temp 98.3 F (36.8 C) (Oral)   Resp 16   Ht  (1.676 m)   Wt 77.1 kg (170 lb)   SpO2 98%   BMI 27.44 kg/m   Physical Exam  Constitutional: She appears well-developed and well-nourished. No distress.  HENT:  Head: Normocephalic and atraumatic.  Right Ear: Tympanic membrane, external ear and ear canal normal.  Left Ear: Tympanic membrane, external ear and ear canal normal.  Nose: Mucosal edema present.  Mouth/Throat: Uvula is midline. No oral lesions. No trismus in the jaw. No dental abscesses or uvula swelling. Posterior oropharyngeal erythema present. No tonsillar abscesses. Tonsils are 3+ on the right. Tonsils are 3+ on the left. Tonsillar exudate.  Eyes: Conjunctivae are normal. Right eye exhibits no discharge. Left eye exhibits no  discharge. No scleral icterus.  Neck: Normal range of motion. Neck supple.  Cardiovascular: Normal rate and regular rhythm.   Pulmonary/Chest: Effort normal. No stridor. No respiratory distress.  Musculoskeletal: She exhibits no edema or deformity.  Lymphadenopathy:    She has no cervical adenopathy.  Skin: She is not diaphoretic.  Nursing note and vitals reviewed.    ED Treatments / Results  Labs (all labs ordered are listed, but only abnormal results are displayed) Labs Reviewed  RAPID STREP SCREEN (NOT AT [facility])  CULTURE, GROUP A STREP [facility])    EKG  EKG Interpretation None       Radiology No results found.  Procedures Procedures (including critical care time)  Medications Ordered in  ED Medications  penicillin g procaine-penicillin g benzathine (BICILLIN-CR) injection 600000-600000 units (not administered)  dexamethasone (DECADRON) injection 10 mg (not administered)     Initial Impression / Assessment and Plan / ED Course  I have reviewed the triage vital signs and the nursing notes.  Pertinent labs & imaging results that were available during my care of the patient were reviewed by me and considered in my medical decision making (see chart for details).    Yolanda Baird presents for evaluation of 2 days of sore throat that has been gradually worsening.  On exam she has enlarged bilateral, symmetrical, tonsils with exudates present. Uvula is midline, no trismus. Patient is able to drink water while in the ER and swallow normally. No concern for airway. No elevation to the floor the mouth.  While rapid strep is negative I am highly suspicious that this is a false negative, and we'll treat her with penicillin IM, along with Decadron IM. Patient given strict return precautions, and states her understanding.   Final Clinical Impressions(s) / ED Diagnoses   Final diagnoses:  Sore throat    New Prescriptions New Prescriptions   No medications on file     Norman Clay 05/14/17 2210    Mancel Bale, MD 05/15/17 [phone]

## 2017-05-14 NOTE — Discharge Instructions (Signed)
Please take Ibuprofen (Advil, motrin) and Tylenol (acetaminophen) to relieve your pain.  You may take up to 600 MG (3 pills) of normal strength ibuprofen every 8 hours as needed.  In between doses of ibuprofen you make take tylenol, up to 1,000 mg (two extra strength pills).  Do not take more than 3,000 mg tylenol in a 24 hour period.  Please check all medication labels as many medications such as pain and cold medications may contain tylenol.  Do not drink alcohol while taking these medications.  Do not take other NSAID'S while taking ibuprofen (such as aleve or naproxen).  Please take ibuprofen with food to decrease stomach upset.  You may have diarrhea from the antibiotics.   Please stay well hydrated and consider probiotics as they may decrease the severity of your diarrhea.  Please be aware that if you take any hormonal contraception (birth control pills, nexplanon, the ring, etc) that your birth control will not work while you are taking antibiotics and you need to use back up protection as directed on the birth control medication information insert.

## 2017-05-16 LAB — CULTURE, GROUP A STREP (THRC)

## 2017-07-27 ENCOUNTER — Other Ambulatory Visit: Payer: Self-pay

## 2017-07-27 ENCOUNTER — Encounter (HOSPITAL_COMMUNITY): Payer: Self-pay | Admitting: *Deleted

## 2017-07-27 DIAGNOSIS — R1110 Vomiting, unspecified: Secondary | ICD-10-CM | POA: Insufficient documentation

## 2017-07-27 DIAGNOSIS — Z5321 Procedure and treatment not carried out due to patient leaving prior to being seen by health care provider: Secondary | ICD-10-CM | POA: Diagnosis not present

## 2017-07-27 LAB — CBC
HEMATOCRIT: 44.2 % (ref 36.0–46.0)
Hemoglobin: 15.4 g/dL — ABNORMAL HIGH (ref 12.0–15.0)
MCH: 30.9 pg (ref 26.0–34.0)
MCHC: 34.8 g/dL (ref 30.0–36.0)
MCV: 88.6 fL (ref 78.0–100.0)
Platelets: 260 10*3/uL (ref 150–400)
RBC: 4.99 MIL/uL (ref 3.87–5.11)
RDW: 13.4 % (ref 11.5–15.5)
WBC: 13.7 10*3/uL — AB (ref 4.0–10.5)

## 2017-07-27 LAB — COMPREHENSIVE METABOLIC PANEL
ALBUMIN: 3.9 g/dL (ref 3.5–5.0)
ALT: 30 U/L (ref 14–54)
AST: 24 U/L (ref 15–41)
Alkaline Phosphatase: 121 U/L (ref 38–126)
Anion gap: 9 (ref 5–15)
BUN: 10 mg/dL (ref 6–20)
CHLORIDE: 102 mmol/L (ref 101–111)
CO2: 25 mmol/L (ref 22–32)
Calcium: 8.9 mg/dL (ref 8.9–10.3)
Creatinine, Ser: 0.82 mg/dL (ref 0.44–1.00)
GFR calc Af Amer: >60 mL/min (ref >60)
GFR calc non Af Amer: >60 mL/min (ref >60)
GLUCOSE: 118 mg/dL — AB (ref 65–99)
POTASSIUM: 3.6 mmol/L (ref 3.5–5.1)
SODIUM: 136 mmol/L (ref 135–145)
Total Bilirubin: 0.8 mg/dL (ref 0.3–1.2)
Total Protein: 7.2 g/dL (ref 6.5–8.1)

## 2017-07-27 LAB — URINALYSIS, ROUTINE W REFLEX MICROSCOPIC
Bilirubin Urine: NEGATIVE
Glucose, UA: NEGATIVE mg/dL
Hgb urine dipstick: NEGATIVE
KETONES UR: 20 mg/dL — AB
LEUKOCYTES UA: NEGATIVE
Nitrite: NEGATIVE
PROTEIN: 30 mg/dL — AB
SPECIFIC GRAVITY, URINE: 1.032 — AB (ref 1.005–1.030)
pH: 6 (ref 5.0–8.0)

## 2017-07-27 LAB — LIPASE, BLOOD: LIPASE: 23 U/L (ref 11–51)

## 2017-07-27 LAB — I-STAT BETA HCG BLOOD, ED (MC, WL, AP ONLY): I-stat hCG, quantitative: <5 m[IU]/mL (ref <5)

## 2017-07-27 MED ORDER — ONDANSETRON 4 MG PO TBDP
4.0000 mg | ORAL_TABLET | Freq: Once | ORAL | Status: AC
  Administered 2017-07-27: 4 mg via ORAL
  Filled 2017-07-27: qty 1

## 2017-07-27 NOTE — ED Triage Notes (Signed)
The pt is c/o vomiting and diarrhea all day  Sweating today  Lm,p bc

## 2017-07-27 NOTE — ED Notes (Signed)
Pt to [facility] requesting Zofran

## 2017-07-28 ENCOUNTER — Emergency Department (HOSPITAL_COMMUNITY)
Admission: EM | Admit: 2017-07-28 | Discharge: 2017-07-28 | Disposition: A | Discharge status: Home / Self Care | Payer: BLUE CROSS/BLUE SHIELD | Attending: Emergency Medicine | Admitting: Emergency Medicine

## 2017-07-28 NOTE — ED Notes (Signed)
Pt leaving at this time due to wait time patient encouraged to stay.

## 2018-02-08 IMAGING — CT CT RENAL STONE PROTOCOL
2 of 4 series · 17 of 46 positions shown, 19 images · non-contrast
Comparison: None.

CLINICAL DATA: Right flank pain for 3 days

EXAM:
CT ABDOMEN AND PELVIS WITHOUT CONTRAST
TECHNIQUE: Multidetector CT imaging of the abdomen and pelvis was performed
following the standard protocol without IV contrast.

[Series 3: renal stone 5.0 · axial · 0.93mm/px · z∈[+636,+1076]mm · 14 of 98 slices shown, 16 images]
[im 5/98  soft-tissue]
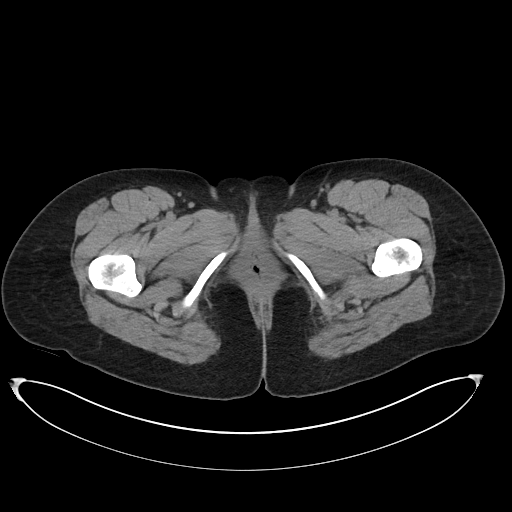
[im 5/98  bone]
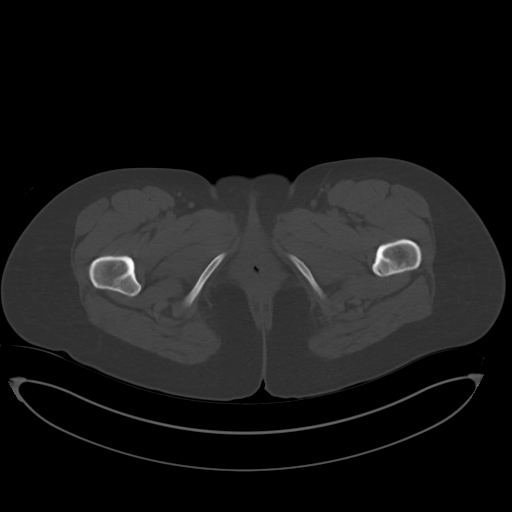
[im 13/98  soft-tissue]
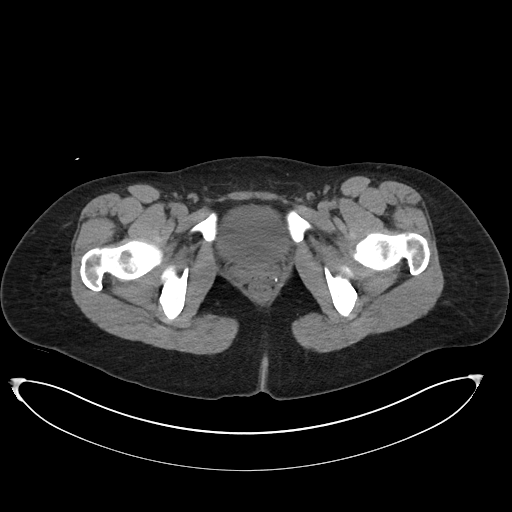
[im 21/98  soft-tissue]
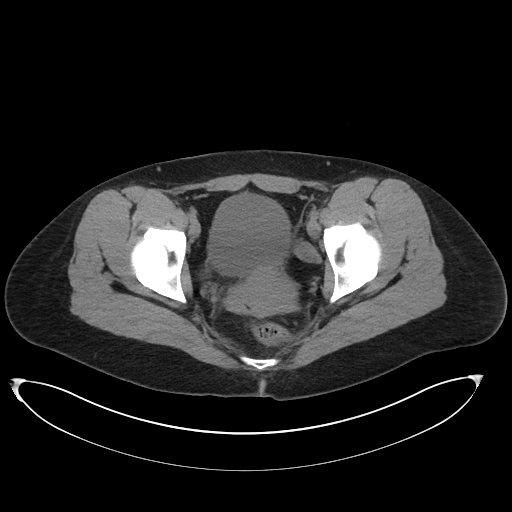
[im 25/98  soft-tissue]
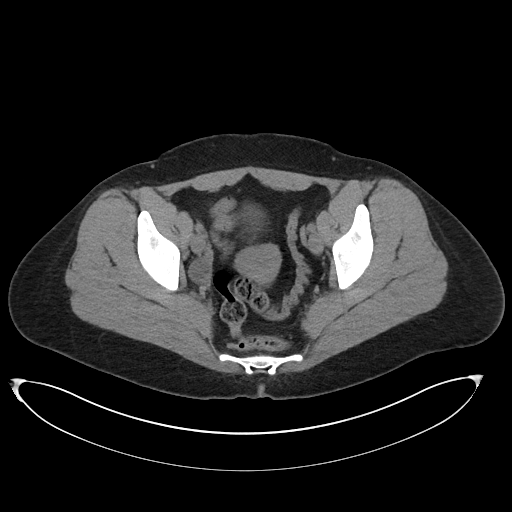
[im 33/98  soft-tissue]
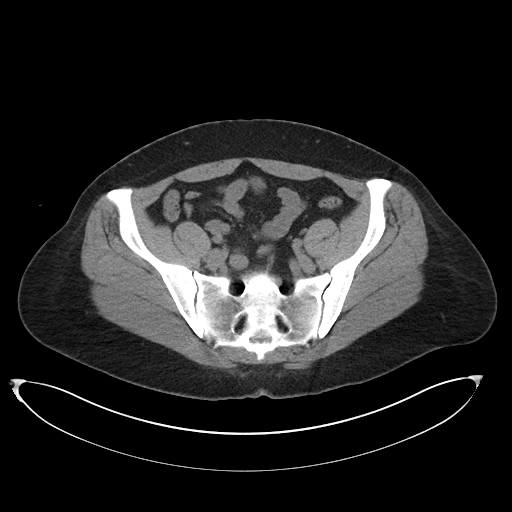
[im 41/98  soft-tissue]
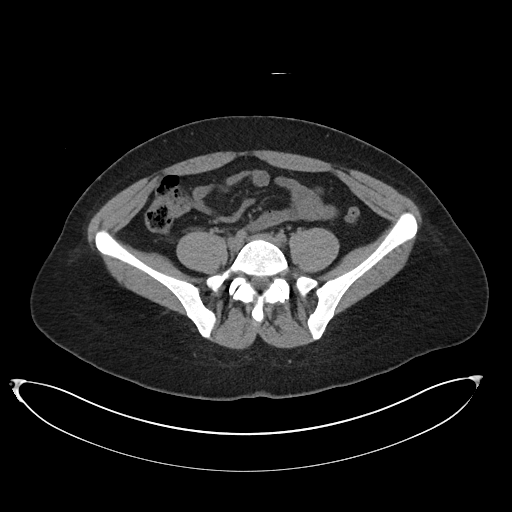
[im 45/98  soft-tissue]
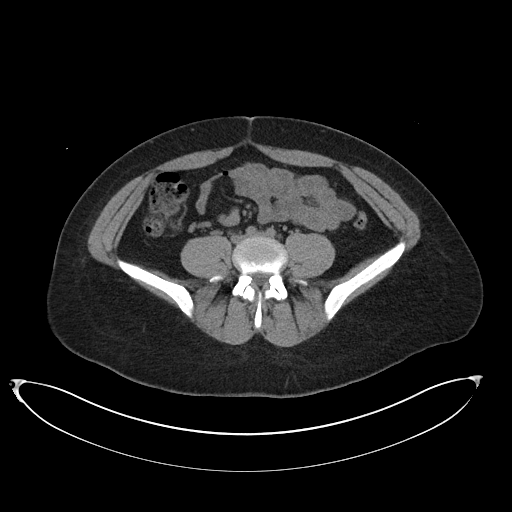
[im 53/98  soft-tissue]
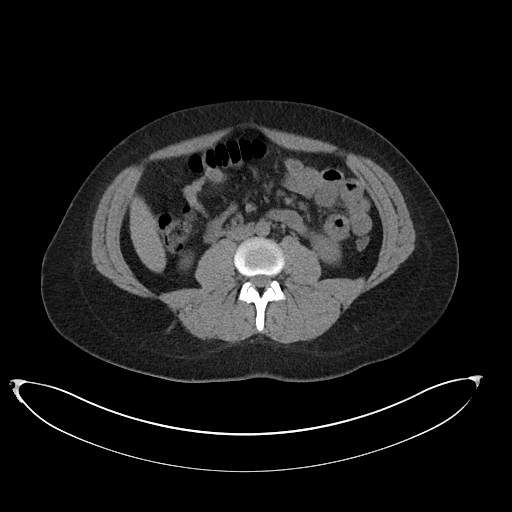
[im 57/98  soft-tissue]
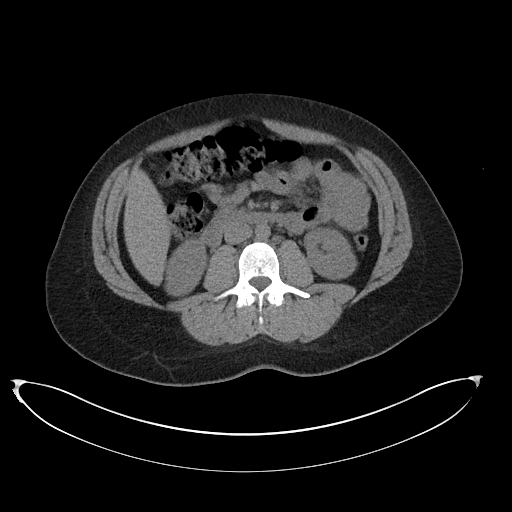
[im 57/98  bone]
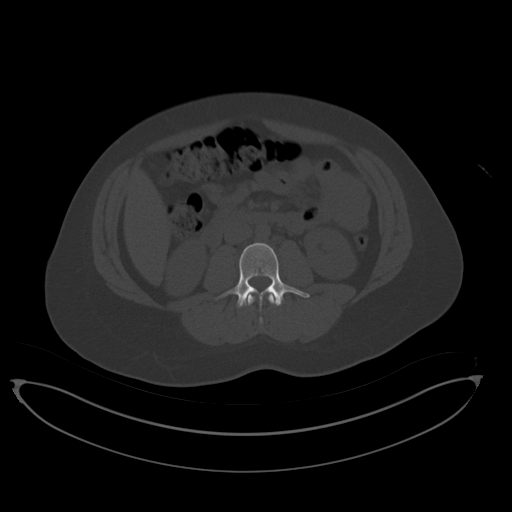
[im 65/98  soft-tissue]
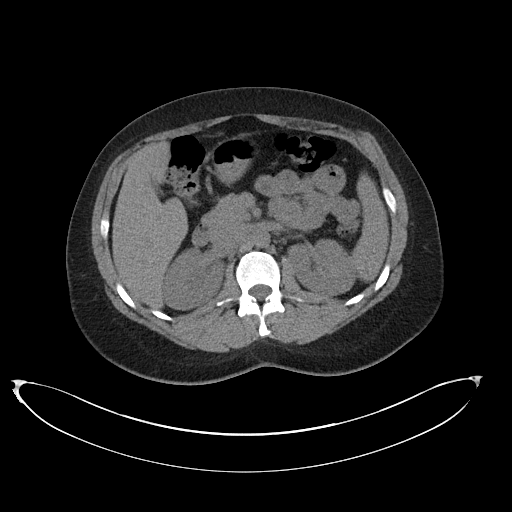
[im 73/98  soft-tissue]
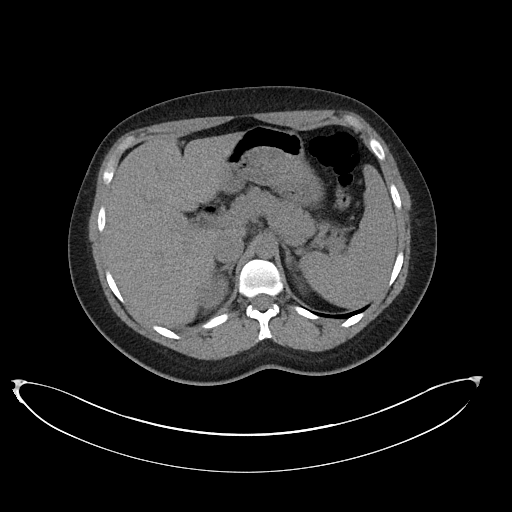
[im 77/98  soft-tissue]
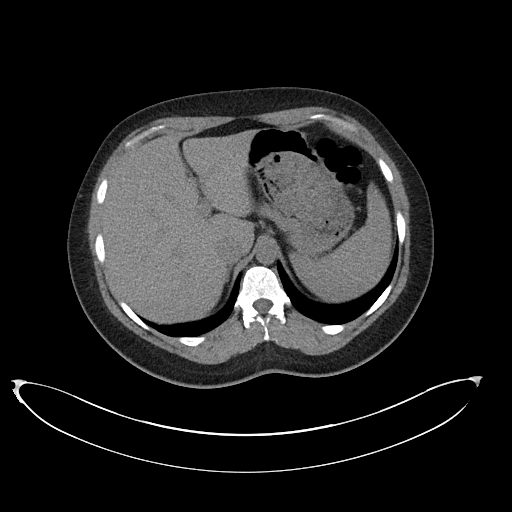
[im 85/98  soft-tissue]
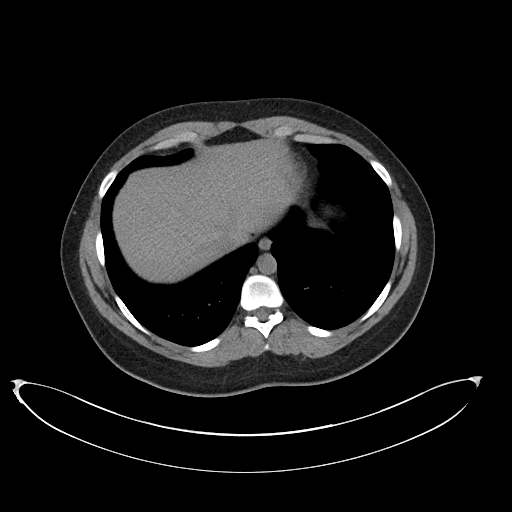
[im 93/98  soft-tissue]
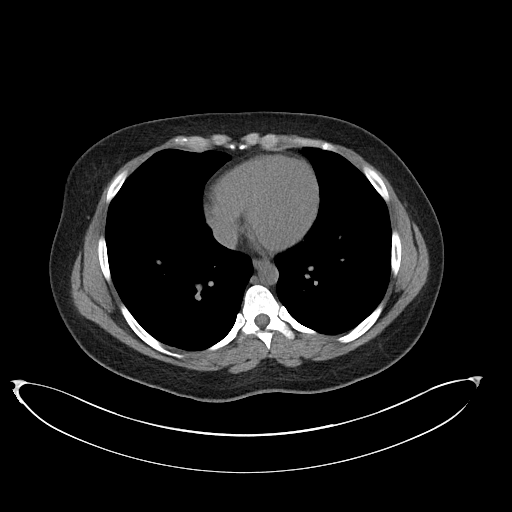

[Series 5: renal stone 3.0 cor · coronal · 0.85mm/px · 3 of 101 slices shown]
[im 34/101  soft-tissue]
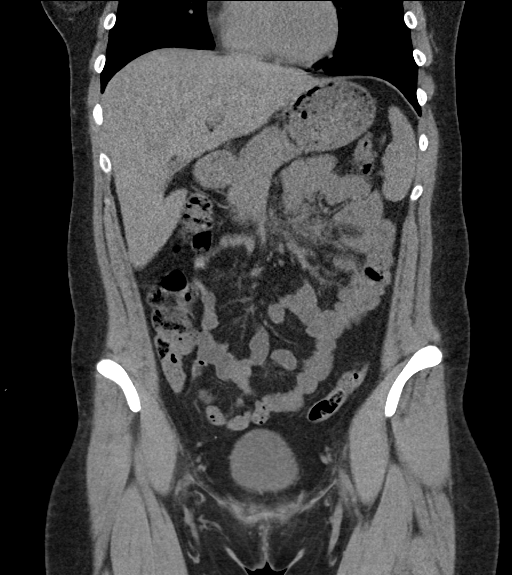
[im 45/101  soft-tissue]
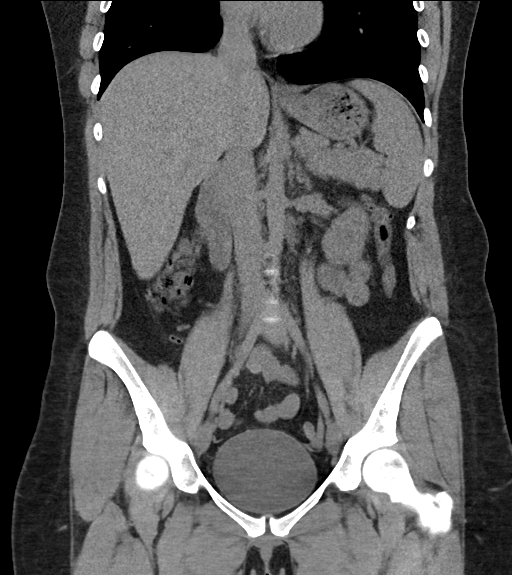
[im 56/101  soft-tissue]
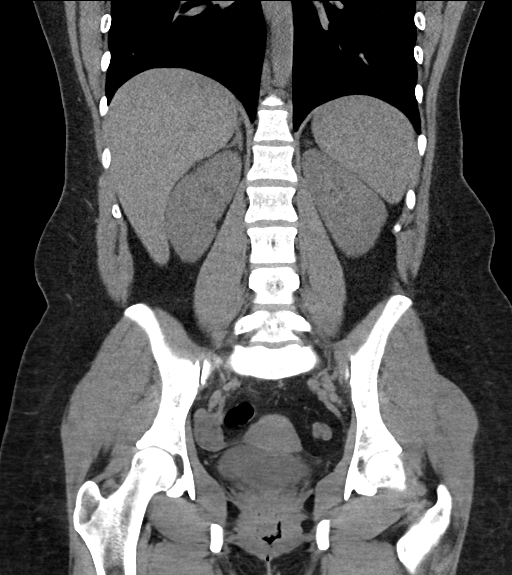

[17 of 46 positions shown; findings below may reference images not displayed]

FINDINGS: Lower chest: 4 mm faint pulmonary nodule in the subpleural right
lower lobe. No pleural effusion or consolidation. Normal heart size.

Hepatobiliary: No focal hepatic abnormality. Gallbladder poorly
identified, suspect that it is very contracted. No biliary
dilatation

Pancreas: Unremarkable. No pancreatic ductal dilatation or
surrounding inflammatory changes.

Spleen: Normal in size without focal abnormality.

Adrenals/Urinary Tract: Adrenal glands are unremarkable. Kidneys are
normal, without renal calculi, focal lesion, or hydronephrosis.
Bladder is unremarkable.

Stomach/Bowel: Stomach is within normal limits. Appendix appears
normal. No evidence of bowel wall thickening, distention, or
inflammatory changes.

Vascular/Lymphatic: No significant vascular findings are present. No
enlarged abdominal or pelvic lymph nodes.

Reproductive: Uterus and bilateral adnexa are unremarkable. 2.5 cm
cyst right ovary.

Other: Negative for free air or free fluid.

Musculoskeletal: No acute or significant osseous findings.
IMPRESSION: 1. Negative for hydronephrosis or ureteral stone
2. Negative appendix
3. No definite CT evidence for acute intra-abdominal or pelvic
pathology

## 2018-04-18 ENCOUNTER — Encounter (HOSPITAL_COMMUNITY): Payer: Self-pay | Admitting: *Deleted

## 2018-04-18 ENCOUNTER — Other Ambulatory Visit: Payer: Self-pay

## 2018-04-18 ENCOUNTER — Emergency Department (HOSPITAL_COMMUNITY)
Admission: EM | Admit: 2018-04-18 | Discharge: 2018-04-18 | Disposition: A | Discharge status: Home / Self Care | Payer: Medicaid Other | Attending: Emergency Medicine | Admitting: Emergency Medicine

## 2018-04-18 DIAGNOSIS — S99921A Unspecified injury of right foot, initial encounter: Secondary | ICD-10-CM | POA: Diagnosis present

## 2018-04-18 DIAGNOSIS — Y9301 Activity, walking, marching and hiking: Secondary | ICD-10-CM | POA: Diagnosis not present

## 2018-04-18 DIAGNOSIS — W01198A Fall on same level from slipping, tripping and stumbling with subsequent striking against other object, initial encounter: Secondary | ICD-10-CM | POA: Diagnosis not present

## 2018-04-18 DIAGNOSIS — Y929 Unspecified place or not applicable: Secondary | ICD-10-CM | POA: Insufficient documentation

## 2018-04-18 DIAGNOSIS — Y998 Other external cause status: Secondary | ICD-10-CM | POA: Diagnosis not present

## 2018-04-18 DIAGNOSIS — F17210 Nicotine dependence, cigarettes, uncomplicated: Secondary | ICD-10-CM | POA: Insufficient documentation

## 2018-04-18 MED ORDER — BACITRACIN ZINC 500 UNIT/GM EX OINT
1.0000 "application " | TOPICAL_OINTMENT | Freq: Two times a day (BID) | CUTANEOUS | Status: DC
  Administered 2018-04-18: 1 via TOPICAL
  Filled 2018-04-18: qty 0.9

## 2018-04-18 NOTE — ED Triage Notes (Signed)
Pt tripped on the cement and cut her R great toe. Reports lac to area and worsening pain. Bandage in place

## 2018-04-18 NOTE — ED Provider Notes (Signed)
[facility]   CSN: [id number] Arrival date & time: 04/18/18  0310     History   Chief Complaint Chief Complaint  Patient presents with  . Toe Injury    HPI Yolanda Baird is a 27 y.o. female.  Patient presents to the emergency department with a chief complaint of right toe pain.  She states that she tripped and skinned her right great toe tonight.  Last tetanus shot is current.  She denies any other injuries.  The history is provided by the patient. No language interpreter was used.    Past Medical History:  Diagnosis Date  . Anemia   . Anxiety   . Bipolar 1 disorder (HCC)   . Bipolar disorder (HCC)   . Depression   . GERD (gastroesophageal reflux disease)   . Headache     Patient Active Problem List   Diagnosis Date Noted  . Tobacco use during pregnancy 04/21/2015  . Bipolar disorder (HCC) 04/07/2015  . History of stillbirth 04/07/2015    Past Surgical History:  Procedure Laterality Date  . TYMPANOSTOMY TUBE PLACEMENT       OB History    Gravida  5   Para  3   Term  1   Preterm  2   AB  2   Living  2     SAB  2   TAB  0   Ectopic  0   Multiple  0   Live Births  2            Home Medications    Prior to Admission medications   Medication Sig Start Date End Date Taking? Authorizing Provider  cephALEXin (KEFLEX) 500 MG capsule Take 1 capsule (500 mg total) by mouth 2 (two) times daily. Patient not taking: Reported on 07/27/2017 04/15/17   Roxy HorsemanBrowning, Toy Eisemann, PA-C  EPINEPHrine 0.3 mg/0.3 mL IJ SOAJ injection Inject 0.3 mg into the muscle as needed (allergic reaction).    [provider]  etonogestrel (NEXPLANON) 68 MG IMPL implant 1 each by Subdermal route once.    [provider]    Family History Family History  Problem Relation Age of Onset  . Diabetes Maternal Grandmother     Social History Social History   Tobacco Use  . Smoking status: Current Every Day  Smoker    Packs/day: 0.50    Types: Cigarettes  . Smokeless tobacco: Never Used  Substance Use Topics  . Alcohol use: No    Alcohol/week: 0.0 standard drinks  . Drug use: No    Types: Cocaine, Marijuana    Comment: quit marijuana 02/28/2015, stopped cocaine with this pregnancy     Allergies   Food   Review of Systems Review of Systems  All other systems reviewed and are negative.    Physical Exam Updated Vital Signs BP 128/90   Pulse 82   Temp 97.6 F (36.4 C)   Resp 18   SpO2 98%   Physical Exam  Constitutional: She is oriented to person, place, and time. No distress.  HENT:  Head: Normocephalic and atraumatic.  Eyes: Pupils are equal, round, and reactive to light. Conjunctivae and EOM are normal.  Neck: No tracheal deviation present.  Cardiovascular: Normal rate.  Pulmonary/Chest: Effort normal. No respiratory distress.  Abdominal: Soft.  Musculoskeletal: Normal range of motion.  Neurological: She is alert and oriented to person, place, and time.  Skin: Skin is warm and dry. She is not diaphoretic.  Mild  abrasion to right great toe, no laceration  Psychiatric: Judgment normal.  Nursing note and vitals reviewed.    ED Treatments / Results  Labs (all labs ordered are listed, but only abnormal results are displayed) Labs Reviewed - No data to display  EKG None  Radiology No results found.  Procedures Procedures (including critical care time)  Medications Ordered in ED Medications  bacitracin ointment 1 application (has no administration in time range)     Initial Impression / Assessment and Plan / ED Course  I have reviewed the triage vital signs and the nursing notes.  Pertinent labs & imaging results that were available during my care of the patient were reviewed by me and considered in my medical decision making (see chart for details).     Patient with mild abrasion to right great toe.  No laceration.  tdap current.  Will clean and  bandage.  Final Clinical Impressions(s) / ED Diagnoses   Final diagnoses:  Injury of toe on right foot, initial encounter    ED Discharge Orders    None       Roxy HorsemanBrowning, Kenzleigh Sedam, PA-C 04/18/18 [id number]    Nira Connardama, Pedro Eduardo, MD 04/18/18 [phone]

## 2018-05-23 ENCOUNTER — Encounter (HOSPITAL_COMMUNITY): Payer: Self-pay | Admitting: *Deleted

## 2018-05-23 ENCOUNTER — Inpatient Hospital Stay (HOSPITAL_COMMUNITY)
Admission: AD | Admit: 2018-05-23 | Discharge: 2018-05-23 | Disposition: A | Discharge status: Home / Self Care | Payer: Medicaid Other | Source: Ambulatory Visit | Attending: Obstetrics and Gynecology | Admitting: Obstetrics and Gynecology

## 2018-05-23 DIAGNOSIS — Z3202 Encounter for pregnancy test, result negative: Secondary | ICD-10-CM | POA: Insufficient documentation

## 2018-05-23 DIAGNOSIS — F17210 Nicotine dependence, cigarettes, uncomplicated: Secondary | ICD-10-CM | POA: Diagnosis not present

## 2018-05-23 DIAGNOSIS — N3000 Acute cystitis without hematuria: Secondary | ICD-10-CM | POA: Diagnosis not present

## 2018-05-23 DIAGNOSIS — R350 Frequency of micturition: Secondary | ICD-10-CM | POA: Diagnosis present

## 2018-05-23 DIAGNOSIS — N8110 Cystocele, unspecified: Secondary | ICD-10-CM | POA: Diagnosis not present

## 2018-05-23 LAB — URINALYSIS, ROUTINE W REFLEX MICROSCOPIC
Bilirubin Urine: NEGATIVE
Glucose, UA: NEGATIVE mg/dL
Hgb urine dipstick: NEGATIVE
Ketones, ur: NEGATIVE mg/dL
Leukocytes, UA: NEGATIVE
Nitrite: POSITIVE — AB
Protein, ur: NEGATIVE mg/dL
Specific Gravity, Urine: 1.019 (ref 1.005–1.030)
pH: 6 (ref 5.0–8.0)

## 2018-05-23 LAB — POCT PREGNANCY, URINE: PREG TEST UR: NEGATIVE

## 2018-05-23 MED ORDER — BENZONATATE 100 MG PO CAPS: 100.0000 mg | ORAL_CAPSULE | Freq: Three times a day (TID) | ORAL | 0 refills | Status: DC

## 2018-05-23 MED ORDER — SULFAMETHOXAZOLE-TRIMETHOPRIM 800-160 MG PO TABS: 1.0000 | ORAL_TABLET | Freq: Two times a day (BID) | ORAL | 0 refills | Status: DC

## 2018-05-23 NOTE — MAU Note (Signed)
Pt states her stomach hurts every time she eats and it makes her super nauseated and pt also states she has a lot of pressure in her vagina.

## 2018-05-23 NOTE — MAU Note (Signed)
Pt reports frequent urination and lower back pain that started yesterday while driving back from [place]. States she also has "something coming out of vagina." States she noticed that 1-2 months ago but it never bothered her until the past couple of days. Pt reports 8/10 sharp, intermittent back pain. Pt denies blood in urine or pain/burning with urination.

## 2018-05-23 NOTE — MAU Provider Note (Signed)
History     CSN: [id number]  Arrival date and time: 05/23/18 1191   First Provider Initiated Contact with Patient 05/23/18 [phone]      Chief Complaint  Patient presents with  . Urinary Frequency   Yolanda Baird is a 27 y.o. G5P2 not currently pregnant who presents to MAU with complaints of urinary frequency and pressure. She reports symptoms have been occurring for the past two weeks but has intensified over the past 2 days. She reports feeling the urge to urinate every 5-10 minutes and feeling "like something is in my vagina, a bulge". She reports pressure and lower back pain is associated with symptoms, describes the sensation as sharp intermittent pain with a constant pressure, rates pain 8/10- has not taken any medication for pain. She denies changes in partners or concern for STDs. She denies dysuria or hematuria.    OB History    Gravida  5   Para  3   Term  1   Preterm  2   AB  2   Living  2     SAB  2   TAB  0   Ectopic  0   Multiple  0   Live Births  2           Past Medical History:  Diagnosis Date  . Anemia   . Anxiety   . Bipolar 1 disorder (HCC)   . Bipolar disorder (HCC)   . Depression   . GERD (gastroesophageal reflux disease)   . Headache     Past Surgical History:  Procedure Laterality Date  . TYMPANOSTOMY TUBE PLACEMENT      Family History  Problem Relation Age of Onset  . Diabetes Maternal Grandmother     Social History   Tobacco Use  . Smoking status: Current Every Day Smoker    Packs/day: 0.50    Types: Cigarettes  . Smokeless tobacco: Never Used  Substance Use Topics  . Alcohol use: No    Alcohol/week: 0.0 standard drinks  . Drug use: No    Types: Cocaine, Marijuana    Comment: quit marijuana 02/28/2015, stopped cocaine with this pregnancy    Allergies:  Allergies  Allergen Reactions  . Food Hives and Swelling    COCONUT    Medications Prior to Admission  Medication Sig Dispense Refill Last Dose  . etonogestrel  (NEXPLANON) 68 MG IMPL implant 1 each by Subdermal route once.   05/23/2018 at Unknown time  . cephALEXin (KEFLEX) 500 MG capsule Take 1 capsule (500 mg total) by mouth 2 (two) times daily. (Patient not taking: Reported on 07/27/2017) 14 capsule 0 Completed Course at Unknown time  . EPINEPHrine 0.3 mg/0.3 mL IJ SOAJ injection Inject 0.3 mg into the muscle as needed (allergic reaction).   More than a month at Unknown time    Review of Systems  Constitutional: Negative.   Respiratory: Negative.   Cardiovascular: Negative.   Gastrointestinal: Negative.   Genitourinary: Positive for frequency, urgency and vaginal pain. Negative for difficulty urinating, dysuria, flank pain, pelvic pain, vaginal bleeding and vaginal discharge.  Musculoskeletal: Positive for back pain.  Neurological: Negative.    Physical Exam   Blood pressure 127/81, pulse 82, temperature 97.9 F (36.6 C), temperature source Oral, resp. rate 20, height 5\' 7"  (1.702 m), weight 102.5 kg, SpO2 99 %.  Physical Exam  Nursing note and vitals reviewed. Constitutional: She is oriented to person, place, and time. She appears well-developed and well-nourished. No distress.  Cardiovascular: Normal  rate, regular rhythm and normal heart sounds.  Respiratory: Effort normal and breath sounds normal. No respiratory distress. She has no wheezes.  GI: Soft. Bowel sounds are normal. She exhibits no distension. There is no tenderness. There is no rebound.  Genitourinary:  Genitourinary Comments: Pelvic exam: anterior mild cystocele palpated with bearing down, no rectocele or uterine prolapse. CMT non tender.    Musculoskeletal: Normal range of motion. She exhibits no edema.  Neurological: She is alert and oriented to person, place, and time.  Psychiatric: She has a normal mood and affect. Her behavior is normal. Thought content normal.    MAU Course  Procedures  MDM Orders Placed This Encounter  Procedures  . Urine Culture  .  Urinalysis, Routine w reflex microscopic  . Pregnancy, urine POC   Labs reviewed:  Results for orders placed or performed during the hospital encounter of 05/23/18 (from the past 24 hour(s))  Urinalysis, Routine w reflex microscopic     Status: Abnormal   Collection Time: 05/23/18  5:19 AM  Result Value Ref Range   Color, Urine YELLOW YELLOW   APPearance CLEAR CLEAR   Specific Gravity, Urine 1.019 1.005 - 1.030   pH 6.0 5.0 - 8.0   Glucose, UA NEGATIVE NEGATIVE mg/dL   Hgb urine dipstick NEGATIVE NEGATIVE   Bilirubin Urine NEGATIVE NEGATIVE   Ketones, ur NEGATIVE NEGATIVE mg/dL   Protein, ur NEGATIVE NEGATIVE mg/dL   Nitrite POSITIVE (A) NEGATIVE   Leukocytes, UA NEGATIVE NEGATIVE   RBC / HPF 0-5 0 - 5 RBC/hpf   WBC, UA 0-5 0 - 5 WBC/hpf   Bacteria, UA RARE (A) NONE SEEN   Squamous Epithelial / LPF 0-5 0 - 5   Mucus PRESENT   Pregnancy, urine POC     Status: None   Collection Time: 05/23/18  5:27 AM  Result Value Ref Range   Preg Test, Ur NEGATIVE NEGATIVE   UA- positive for nitrates, will treat for UTI  UPT- negative  Urine culture pending   Lab reviewed with patient. Discussed treatment of UTI with medication. Educated that will call patient with results of culture if there are changes needed to be made to treatment plan. Patient complains of congestion and cough prior to going home- Rx for Tessalon pearls sent to pharmacy of choice. Educated and discussed Kegal exercises to build pelvic floor strength. Pt stable at time of discharge.   Assessment and Plan   1. Acute cystitis without hematuria   2. Female cystocele    Discharge home  Follow up as scheduled with PCP or urgent care for worsening symptoms  Rx for Tessalon and Bactrim  Kegal exercises   Allergies as of 05/23/2018      Reactions   Food Hives, Swelling   COCONUT      Medication List    STOP taking these medications   cephALEXin 500 MG capsule Commonly known as:  KEFLEX     TAKE these medications    benzonatate 100 MG capsule Commonly known as:  TESSALON Take 1 capsule (100 mg total) by mouth every 8 (eight) hours.   EPINEPHrine 0.3 mg/0.3 mL Soaj injection Commonly known as:  EPI-PEN Inject 0.3 mg into the muscle as needed (allergic reaction).   etonogestrel 68 MG Impl implant Commonly known as:  NEXPLANON 1 each by Subdermal route once.   sulfamethoxazole-trimethoprim 800-160 MG tablet Commonly known as:  BACTRIM DS,SEPTRA DS Take 1 tablet by mouth 2 (two) times daily.  Sharyon Cable CNM 05/23/2018, 6:16 AM

## 2018-05-24 LAB — URINE CULTURE: Culture: NO GROWTH

## 2018-09-16 ENCOUNTER — Emergency Department (HOSPITAL_COMMUNITY): Payer: Medicaid Other

## 2018-09-16 ENCOUNTER — Other Ambulatory Visit: Payer: Self-pay

## 2018-09-16 ENCOUNTER — Encounter (HOSPITAL_COMMUNITY): Payer: Self-pay | Admitting: Emergency Medicine

## 2018-09-16 ENCOUNTER — Emergency Department (HOSPITAL_COMMUNITY)
Admission: EM | Admit: 2018-09-16 | Discharge: 2018-09-16 | Disposition: A | Discharge status: Home / Self Care | Payer: Medicaid Other | Attending: Emergency Medicine | Admitting: Emergency Medicine

## 2018-09-16 DIAGNOSIS — R079 Chest pain, unspecified: Secondary | ICD-10-CM | POA: Diagnosis not present

## 2018-09-16 DIAGNOSIS — R197 Diarrhea, unspecified: Secondary | ICD-10-CM | POA: Diagnosis not present

## 2018-09-16 DIAGNOSIS — J111 Influenza due to unidentified influenza virus with other respiratory manifestations: Secondary | ICD-10-CM | POA: Diagnosis not present

## 2018-09-16 DIAGNOSIS — R05 Cough: Secondary | ICD-10-CM | POA: Diagnosis not present

## 2018-09-16 DIAGNOSIS — R69 Illness, unspecified: Secondary | ICD-10-CM

## 2018-09-16 DIAGNOSIS — F319 Bipolar disorder, unspecified: Secondary | ICD-10-CM | POA: Diagnosis not present

## 2018-09-16 DIAGNOSIS — F17210 Nicotine dependence, cigarettes, uncomplicated: Secondary | ICD-10-CM | POA: Diagnosis not present

## 2018-09-16 DIAGNOSIS — R062 Wheezing: Secondary | ICD-10-CM | POA: Insufficient documentation

## 2018-09-16 DIAGNOSIS — R6883 Chills (without fever): Secondary | ICD-10-CM | POA: Insufficient documentation

## 2018-09-16 DIAGNOSIS — J208 Acute bronchitis due to other specified organisms: Secondary | ICD-10-CM | POA: Diagnosis not present

## 2018-09-16 DIAGNOSIS — R0602 Shortness of breath: Secondary | ICD-10-CM | POA: Diagnosis not present

## 2018-09-16 MED ORDER — BENZONATATE 200 MG PO CAPS: 200.0000 mg | ORAL_CAPSULE | Freq: Three times a day (TID) | ORAL | 0 refills | Status: AC | PRN

## 2018-09-16 MED ORDER — LOPERAMIDE HCL 2 MG PO CAPS: 4.0000 mg | ORAL_CAPSULE | Freq: Every evening | ORAL | 0 refills | Status: DC | PRN

## 2018-09-16 MED ORDER — IPRATROPIUM-ALBUTEROL 0.5-2.5 (3) MG/3ML IN SOLN
3.0000 mL | Freq: Once | RESPIRATORY_TRACT | Status: AC
  Administered 2018-09-16: 3 mL via RESPIRATORY_TRACT
  Filled 2018-09-16: qty 3

## 2018-09-16 MED ORDER — PREDNISONE 20 MG PO TABS: 60.0000 mg | ORAL_TABLET | Freq: Every day | ORAL | 0 refills | Status: AC

## 2018-09-16 MED ORDER — ACETAMINOPHEN 500 MG PO TABS: 500.0000 mg | ORAL_TABLET | Freq: Four times a day (QID) | ORAL | 0 refills | Status: AC | PRN

## 2018-09-16 NOTE — ED Triage Notes (Signed)
Pt here for cold symptoms, dry cough, chills, nausea/vomiting/dirrhea for 5 days. Pt states she has not been around anyone who is sick recently. States she has diarrhea every 15 minutes (after drinking anything also after coughing, she has to go to the bathroom. Pt states she also feels short of breath with the cough. Pt states she had abdominal and flank pain but has not had that pain since yesterday. Pt states her mom gave her something for the diarrhea but doesn't remember what it was, it didn't help.

## 2018-09-16 NOTE — ED Notes (Signed)
ED Provider at bedside. 

## 2018-09-16 NOTE — Discharge Instructions (Addendum)
We saw you in the ER for diarrhea and flulike symptoms. We think what you have is a viral syndrome - the treatment for which is symptomatic relief only, and your body will fight the infection off in a few days.  We are prescribing you some meds for pain and fevers. See your primary care doctor in 1 week if the symptoms dont improve.

## 2018-09-16 NOTE — ED Provider Notes (Signed)
[facility] EMERGENCY DEPARTMENT Provider Note   CSN: [id number] Arrival date & time: 09/16/18  1532     History   Chief Complaint Chief Complaint  Patient presents with  . Cough  . Diarrhea  . Chills    HPI Yolanda Baird is a 28 y.o. female.  HPI  28 year old female comes in with chief complaint of cough, diarrhea and chills. Patient states that she started getting sick about 6 days ago.  Her symptoms initially comprised of cough, congestion, sore throat.  Over time she has had some chills and weakness.  Her cough is mostly dry.  She reports that lately she is also been having some loose bowel movements, including at nighttime. She denies any sick contact. She is having 5-10 loose BM/day.  Past Medical History:  Diagnosis Date  . Anemia   . Anxiety   . Bipolar 1 disorder (HCC)   . Bipolar disorder (HCC)   . Depression   . GERD (gastroesophageal reflux disease)   . Headache     Patient Active Problem List   Diagnosis Date Noted  . Tobacco use during pregnancy 04/21/2015  . Bipolar disorder (HCC) 04/07/2015  . History of stillbirth 04/07/2015    Past Surgical History:  Procedure Laterality Date  . TYMPANOSTOMY TUBE PLACEMENT       OB History    Gravida  5   Para  3   Term  1   Preterm  2   AB  2   Living  2     SAB  2   TAB  0   Ectopic  0   Multiple  0   Live Births  2            Home Medications    Prior to Admission medications   Medication Sig Start Date End Date Taking? Authorizing Provider  acetaminophen (TYLENOL) 500 MG tablet Take 1-2 tablets (500-1,000 mg total) by mouth every 6 (six) hours as needed for up to 10 days. 09/16/18 09/26/18  Derwood Kaplan, MD  benzonatate (TESSALON) 200 MG capsule Take 1 capsule (200 mg total) by mouth 3 (three) times daily as needed for up to 7 days for cough. 09/16/18 09/23/18  Derwood Kaplan, MD  EPINEPHrine 0.3 mg/0.3 mL IJ SOAJ injection Inject 0.3 mg into the muscle as  needed (allergic reaction).    [provider]  etonogestrel (NEXPLANON) 68 MG IMPL implant 1 each by Subdermal route once.    [provider]  loperamide (IMODIUM) 2 MG capsule Take 2 capsules (4 mg total) by mouth at bedtime as needed for diarrhea or loose stools. 09/16/18   Derwood Kaplan, MD  predniSONE (DELTASONE) 20 MG tablet Take 3 tablets (60 mg total) by mouth daily for 5 days. 09/16/18 09/21/18  Derwood Kaplan, MD  sulfamethoxazole-trimethoprim (BACTRIM DS,SEPTRA DS) 800-160 MG tablet Take 1 tablet by mouth 2 (two) times daily. 05/23/18   Sharyon Cable, CNM    Family History Family History  Problem Relation Age of Onset  . Diabetes Maternal Grandmother     Social History Social History   Tobacco Use  . Smoking status: Current Every Day Smoker    Packs/day: 0.50    Types: Cigarettes  . Smokeless tobacco: Never Used  Substance Use Topics  . Alcohol use: No    Alcohol/week: 0.0 standard drinks  . Drug use: No    Types: Cocaine, Marijuana    Comment: quit marijuana 02/28/2015, stopped cocaine with this pregnancy  Allergies   Food   Review of Systems Review of Systems  Constitutional: Positive for activity change.  Respiratory: Positive for shortness of breath and wheezing.   Gastrointestinal: Positive for diarrhea.  Allergic/Immunologic: Negative for immunocompromised state.  All other systems reviewed and are negative.    Physical Exam Updated Vital Signs BP 126/81 (BP Location: Right Arm)   Pulse 90   Temp 98 F (36.7 C) (Oral)   Resp 16   SpO2 99%   Physical Exam Vitals signs and nursing note reviewed.  Constitutional:      Appearance: She is well-developed.  HENT:     Head: Normocephalic and atraumatic.  Neck:     Musculoskeletal: Normal range of motion and neck supple.  Cardiovascular:     Rate and Rhythm: Normal rate.  Pulmonary:     Effort: Pulmonary effort is normal.     Breath sounds: Wheezing present.  Abdominal:       General: Bowel sounds are normal.  Skin:    General: Skin is warm and dry.  Neurological:     Mental Status: She is alert and oriented to person, place, and time.      ED Treatments / Results  Labs (all labs ordered are listed, but only abnormal results are displayed) Labs Reviewed - No data to display  EKG None  [facility]  Result Date: 09/16/2018 CLINICAL DATA:  Chest pain and productive cough today. EXAM: PORTABLE CHEST 1 VIEW COMPARISON:  None. FINDINGS: The heart size and mediastinal contours are within normal limits. Both lungs are clear. The visualized skeletal structures are unremarkable. IMPRESSION: No active disease. Electronically Signed   By: Sherian ReinWei-Chen  Lin M.D.   On: 09/16/2018 16:38    Procedures Procedures (including critical care time)  Medications Ordered in ED Medications  ipratropium-albuterol (DUONEB) 0.5-2.5 (3) MG/3ML nebulizer solution 3 mL (3 mLs Nebulization Given 09/16/18 1633)     Initial Impression / Assessment and Plan / ED Course  I have reviewed the triage vital signs and the nursing notes.  Pertinent labs & imaging results that were available during my care of the patient were reviewed by me and considered in my medical decision making (see chart for details).     Patient comes in with chief complaint of URI-like symptoms along with diarrhea. He might be having a flu, however symptoms started 6 days ago.  She is nontoxic-appearing and is immunocompetent and does not have any underlying lung or heart disease. On exam she is wheezing.  X-ray ordered to ensure there is no superimposed pneumonia.  X-ray is negative therefore we will treat her as if she has a viral bronchitis. Additionally, we will give her loperamide for her nighttime diarrhea. Advised PCP follow-up if her symptoms are not improving.  Final Clinical Impressions(s) / ED Diagnoses   Final diagnoses:  Influenza-like illness  Acute bronchitis, viral   Diarrhea, unspecified type    ED Discharge Orders         Ordered    benzonatate (TESSALON) 200 MG capsule  3 times daily PRN     09/16/18 1720    predniSONE (DELTASONE) 20 MG tablet  Daily     09/16/18 1720    acetaminophen (TYLENOL) 500 MG tablet  Every 6 hours PRN     09/16/18 1720    loperamide (IMODIUM) 2 MG capsule  At bedtime PRN     09/16/18 1721           Jathan Balling,  MD 09/16/18 1730

## 2018-11-19 ENCOUNTER — Telehealth: Payer: Medicaid Other | Admitting: Family

## 2018-11-19 DIAGNOSIS — R05 Cough: Secondary | ICD-10-CM

## 2018-11-19 DIAGNOSIS — R059 Cough, unspecified: Secondary | ICD-10-CM

## 2018-11-19 DIAGNOSIS — R079 Chest pain, unspecified: Secondary | ICD-10-CM

## 2018-11-19 DIAGNOSIS — R61 Generalized hyperhidrosis: Secondary | ICD-10-CM

## 2018-11-19 NOTE — Progress Notes (Signed)
Based on what you shared with me, I feel your condition warrants further evaluation and I recommend that you be seen for a face to face office visit.  Based on your symptoms of cough, chest pain, and night sweats, you need to be seen face to face to be evaluated.   Approximately 5 minutes was spent documenting and reviewing patient's chart.     NOTE: If you entered your credit card information for this eVisit, you will not be charged. You may see a "hold" on your card for the $35 but that hold will drop off and you will not have a charge processed.  If you are having a true medical emergency please call 911.  If you need an urgent face to face visit, [facility] has four urgent care centers for your convenience.    PLEASE NOTE: THE [facility] LOCATIONS AND URGENT CARE CLINICS DO NOT HAVE THE TESTING FOR CORONAVIRUS COVID19 AVAILABLE.  IF YOU FEEL YOU NEED THIS TEST YOU MUST HAVE AN ORDER TO GO TO A TESTING LOCATION FROM YOUR PROVIDER OR FROM A SCREENING E-VISIT     [email or web address] to reserve your spot online an avoid wait times  [facility] [address], [address] [place], [place] [address] 8 am to 8 pm Monday-Friday 10 am to 4 pm Saturday-Sunday *Across the street from [employer]  [address] [place], [address] 8 am to 5 pm Monday-Friday * In the [facility] on the [facility]   The following sites will take your insurance:  . [facility]  [phone] Get Driving Directions Find a Provider at this Location  [address] [place], [place] [address] . 10 am to 8 pm Monday-Friday . 12 pm to 8 pm Saturday-Sunday   . [facility] at [facility]  [phone] Get Driving Directions Find a Provider at this Location  [address], [address] [place], [place] [address] . 8 am to 8 pm Monday-Friday . 9 am to 6 pm Saturday . 11 am to 6 pm Sunday   . [facility]  Urgent Care at [facility]  [phone] Get Driving Directions  [address] [place], [place] [address] . 8 am to 8 pm Monday-Friday . 8 am to 4 pm Saturday-Sunday   Your e-visit answers were reviewed by a board certified advanced clinical practitioner to complete your personal care plan.  Thank you for using e-Visits.

## 2019-03-06 ENCOUNTER — Telehealth: Payer: Self-pay | Admitting: Medical

## 2019-03-06 NOTE — Telephone Encounter (Signed)
Called the patient to complete the pre-screen. The patient answered no to COVID19 symptoms and/or being previously diagnosed. Informed the patient of the wearing a face mask, sanitizing hands at the sanitizing station upon entering our office, and no visitors or children are allowed due to the COVID19 restrictions. The patient verbalized understanding. °

## 2019-03-07 ENCOUNTER — Other Ambulatory Visit: Payer: Self-pay

## 2019-03-07 ENCOUNTER — Encounter: Payer: Self-pay | Admitting: Obstetrics and Gynecology

## 2019-03-07 ENCOUNTER — Ambulatory Visit (INDEPENDENT_AMBULATORY_CARE_PROVIDER_SITE_OTHER): Payer: Medicaid Other | Admitting: Obstetrics and Gynecology

## 2019-03-07 VITALS — BP 107/74 | HR 82 | Temp 98.4°F | Ht 67.0 in | Wt 230.0 lb

## 2019-03-07 DIAGNOSIS — Z3046 Encounter for surveillance of implantable subdermal contraceptive: Secondary | ICD-10-CM | POA: Diagnosis not present

## 2019-03-07 DIAGNOSIS — Z30017 Encounter for initial prescription of implantable subdermal contraceptive: Secondary | ICD-10-CM

## 2019-03-07 LAB — POCT PREGNANCY, URINE: Preg Test, Ur: NEGATIVE

## 2019-03-07 MED ORDER — ETONOGESTREL 68 MG ~~LOC~~ IMPL
68.0000 mg | DRUG_IMPLANT | Freq: Once | SUBCUTANEOUS | Status: AC
  Administered 2019-03-07: 16:00:00 68 mg via SUBCUTANEOUS

## 2019-03-07 NOTE — Procedures (Signed)
Nexplanon Removal and Insertion Procedure Note  Nexplanon placed late 2016. Pt amenorrheic until this past month. Last sexual intercourse a few days with period. I discussed with her risk of being pregnant now even with negative UPT. Pt would like new nexplanon anyways.   Prior to the procedure being performed, the patient (or guardian) was asked to state their full name, date of birth, type of procedure being performed and the exact location of the operative site. This information was then checked against the documentation in the patient's chart. Prior to the procedure being performed, a "time out" was performed by the physician that confirmed the correct patient, procedure and site.  After informed consent was obtained, the patient's left arm was examined and the Nexplanon rod was noted to be somewhat deep but palpable. The area was cleaned with alcohol then local anesthesia was infiltrated with 3 ml of 1% lidocaine. The area was prepped with betadine. Using sterile technique, the Nexplanon device was brought to the incision site. The capsule was scrapped off with the scalpel, the Nexplanon grasped with hemostats, and easily removed; the removal site was hemostatic. The Nexplanon was inspected and noted to be intact.   Because the site was somewhat deep, I didn't want to place another one in the same area. So an area approximately 5cm above the prior site was swabbed with alcohol and injected with 82mL of lidocaine with epi. The area was then swabbed with betadine and the Nexplanon inserted per the manufacturer's instructions.  A steri-strip and a pressure dressing was applied to both areas.   The patient tolerated the procedure well.  Patient to take home UPT in 3 weeks.   Durene Romans MD Attending Center for [employer] farm manager)

## 2019-03-11 ENCOUNTER — Ambulatory Visit (INDEPENDENT_AMBULATORY_CARE_PROVIDER_SITE_OTHER)
Admission: RE | Admit: 2019-03-11 | Discharge: 2019-03-11 | Disposition: A | Discharge status: Home / Self Care | Payer: Medicaid Other | Source: Ambulatory Visit

## 2019-03-11 DIAGNOSIS — M79602 Pain in left arm: Secondary | ICD-10-CM | POA: Diagnosis not present

## 2019-03-11 MED ORDER — MELOXICAM 7.5 MG PO TABS: 7.5000 mg | ORAL_TABLET | Freq: Every day | ORAL | 0 refills | Status: AC

## 2019-03-11 NOTE — ED Provider Notes (Signed)
Virtual Visit via Video Note:  Yolanda Baird  initiated request for Telemedicine visit with [facility] Urgent Care team. I connected with Yolanda Baird  on 03/11/2019 at 1:34 PM  for a synchronized telemedicine visit using a video enabled HIPPA compliant telemedicine application. I verified that I am speaking with Yolanda Baird  using two identifiers. Yolanda Jodell Cipro, PA-C  was physically located in a [facility] Urgent care site and Yolanda Baird was located at a different location.   The limitations of evaluation and management by telemedicine as well as the availability of in-person appointments were discussed. Patient was informed that she  may incur a bill ( including co-pay) for this virtual visit encounter. Yolanda Baird  expressed understanding and gave verbal consent to proceed with virtual visit.     History of Present Illness:Yolanda Baird  is a 28 y.o. female presents with 5-day history of swelling to the left upper arm.  Patient states had her Nexplanon removed 5 days ago.  Her Nexplanon was overdue for removal for few months, and provider had mentioned it was a difficult removal.  A new Nexplanon was implanted slightly above old removal site.  Since then, patient has had swelling, contusion to the implant site.  She has had painful movement of the arm.  She denies spreading erythema, though with slight warmth.  Denies fever, chills, night sweats.  She has not tried anything for the symptoms.  Past Medical History:  Diagnosis Date  . Anemia   . Anxiety   . Bipolar 1 disorder ([place])   . Depression   . GERD (gastroesophageal reflux disease)   . Headache     Allergies  Allergen Reactions  . Food Hives and Swelling    COCONUT        Observations/Objective: General: Well appearing, nontoxic, no acute distress. Sitting comfortably. Head: Normocephalic, atraumatic Eye: No conjunctival injection, eyelid swelling. EOMI ENT: Mucus membranes moist, no lip cracking. No obvious nasal drainage. Pulm:  Speaking in full sentences without difficulty. Normal effort. No respiratory distress, accessory muscle use. MSK: Contusion to the left upper arm surrounding new nexplanon.  No erythema seen. Patient states slight warmth. Mild tenderness to area. Patient able to palpation nexplanon in place. She has full ROM of shoulder and elbow. Neuro: Normal mental status. Alert and oriented x 3.   Assessment and Plan: Discussed possible inflammation causing symptoms given difficult removal of old nexplanon. Will start mobic at this time. Ice compress to the area. Follow up with her provider if symptoms not improving for further evaluation and management needed. Return precautions given. Patient expresses understanding and agrees to plan.  Follow Up Instructions:    I discussed the assessment and treatment plan with the patient. The patient was provided an opportunity to ask questions and all were answered. The patient agreed with the plan and demonstrated an understanding of the instructions.   The patient was advised to call back or seek an in-person evaluation if the symptoms worsen or if the condition fails to improve as anticipated.  I provided 20 minutes of non-face-to-face time during this encounter.    Ok Edwards, PA-C  03/11/2019 1:34 PM         Ok Edwards, PA-C 03/11/19 1845

## 2019-03-11 NOTE — Discharge Instructions (Signed)
Start Mobic. Do not take ibuprofen (motrin/advil)/ naproxen (aleve) while on mobic. Continue ice compress, rest. Make appointment with your provider in 3 days, if symptoms not improving, follow up for reevaluation needed. If experiencing worsening swelling, worsening warmth, spreading redness/traveling up your arm, go to the ED for further evaluation needed.

## 2019-03-14 ENCOUNTER — Telehealth: Payer: Medicaid Other | Admitting: Family

## 2019-03-14 DIAGNOSIS — Z975 Presence of (intrauterine) contraceptive device: Secondary | ICD-10-CM

## 2019-03-14 NOTE — Progress Notes (Signed)
Based on what you shared with me, I feel your condition warrants further evaluation and I recommend that you be seen for a face to face office visit.  Given your symptoms you need to follow up face to face to be evaluated.    NOTE: If you entered your credit card information for this eVisit, you will not be charged. You may see a "hold" on your card for the $35 but that hold will drop off and you will not have a charge processed.  If you are having a true medical emergency please call 911.     For an urgent face to face visit, [facility] has five urgent care centers for your convenience:    [email or web address] to reserve your spot online an avoid wait times  [facility] [address] [place], [place] [address] Modified hours of operation: Monday-Friday, 12 PM to 6 PM  Closed Saturday & Sunday  *Across the street from [place] (New Address!) [address] [place], [place] [address] *Just off [address], across the road from [place] hours of operation: Monday-Friday, 12 PM to 6 PM  Closed Saturday & Sunday   The following sites will take your insurance:  . [facility]    [phone]                  Get Driving Directions  [address], [place] [address] . 10 am to 8 pm Monday-Friday . 12 pm to 8 pm Saturday-Sunday   . [facility] at [place]                  Get Driving Directions  [address], [place], [place] [address] . 8 am to 8 pm Monday-Friday . 9 am to 6 pm Saturday . 11 am to 6 pm Sunday   . [facility] at [place]                  Get Driving Directions   [address] [place], [place] [address] . 8 am to 8 pm Monday-Friday . 8 am to 4 pm Saturday-Sunday    . [facility] at [place]  [address] [place], [place] [address]  . Monday-Friday, 12 PM to 6 PM    Your e-visit answers were reviewed by a board certified advanced clinical practitioner to complete your personal care plan.  Thank you for using e-Visits.

## 2019-03-15 ENCOUNTER — Encounter: Payer: Self-pay | Admitting: *Deleted

## 2019-04-20 ENCOUNTER — Ambulatory Visit: Payer: Medicaid Other | Admitting: Obstetrics and Gynecology

## 2019-05-17 ENCOUNTER — Encounter: Payer: Self-pay | Admitting: Obstetrics and Gynecology

## 2019-05-17 ENCOUNTER — Ambulatory Visit: Payer: Medicaid Other | Admitting: Obstetrics and Gynecology

## 2019-05-18 NOTE — Progress Notes (Signed)
Patient did not keep her GYN appointment for 05/17/2019.  Durene Romans MD Attending Center for [employer])

## 2020-02-17 DIAGNOSIS — M25461 Effusion, right knee: Secondary | ICD-10-CM | POA: Diagnosis not present

## 2020-02-17 DIAGNOSIS — M5441 Lumbago with sciatica, right side: Secondary | ICD-10-CM | POA: Diagnosis not present

## 2020-02-17 DIAGNOSIS — M25561 Pain in right knee: Secondary | ICD-10-CM | POA: Diagnosis not present

## 2020-04-29 ENCOUNTER — Telehealth: Payer: Self-pay

## 2020-04-29 NOTE — Telephone Encounter (Signed)
Pt call transferred from the front office.  Pt informed me that she feels like her implant has broken in half.  Pt states that she was carrying dog food, she dropped the bad, and then she felt a whelp.  I advised pt that a provider would need to evaluate her concern if she could come in tomorrow 04/30/20 @ 1515.  Pt verbalized understanding.    Addison Naegeli, RN 04/29/20

## 2020-04-30 ENCOUNTER — Encounter: Payer: Self-pay | Admitting: Obstetrics and Gynecology

## 2020-04-30 ENCOUNTER — Other Ambulatory Visit: Payer: Self-pay

## 2020-04-30 ENCOUNTER — Ambulatory Visit (INDEPENDENT_AMBULATORY_CARE_PROVIDER_SITE_OTHER): Payer: Medicaid Other | Admitting: Obstetrics and Gynecology

## 2020-04-30 DIAGNOSIS — Z975 Presence of (intrauterine) contraceptive device: Secondary | ICD-10-CM

## 2020-04-30 DIAGNOSIS — Z30013 Encounter for initial prescription of injectable contraceptive: Secondary | ICD-10-CM

## 2020-04-30 DIAGNOSIS — Z3202 Encounter for pregnancy test, result negative: Secondary | ICD-10-CM

## 2020-04-30 DIAGNOSIS — Z309 Encounter for contraceptive management, unspecified: Secondary | ICD-10-CM | POA: Insufficient documentation

## 2020-04-30 LAB — POCT PREGNANCY, URINE: Preg Test, Ur: NEGATIVE

## 2020-04-30 MED ORDER — MEDROXYPROGESTERONE ACETATE 150 MG/ML IM SUSP
150.0000 mg | Freq: Once | INTRAMUSCULAR | Status: AC
  Administered 2020-04-30: 150 mg via INTRAMUSCULAR

## 2020-04-30 NOTE — Progress Notes (Signed)
Yolanda Baird is a 29 y.o. [id number] here for Nexplanon removal.    No other gynecologic concerns.   Nexplanon Removal Patient identified, informed consent performed, consent signed.   Appropriate time out taken. Nexplanon site identified.  Area prepped in usual sterile fashon. One ml of 1% lidocaine was used to anesthetize the area at the distal end of the implant. A small stab incision was made right beside the implant on the distal portion.  The Nexplanon rod was grasped using hemostats and removed without difficulty.  There was minimal blood loss. There were no complications.  3 ml of 1% lidocaine was injected around the incision for post-procedure analgesia.  Steri-strips were applied over the small incision.  A pressure bandage was applied to reduce any bruising.  The patient tolerated the procedure well and was given post procedure instructions.  Patient is planning to use Depo Provera for contraception/attempt conception.   Hermina Staggers, MD, FACOG Attending Obstetrician & Gynecologist Center for [facility], [facility]

## 2020-07-16 ENCOUNTER — Ambulatory Visit: Payer: Medicaid Other

## 2020-10-08 ENCOUNTER — Emergency Department (HOSPITAL_COMMUNITY)
Admission: EM | Admit: 2020-10-08 | Discharge: 2020-10-08 | Disposition: A | Discharge status: Home / Self Care | Payer: Medicaid Other | Attending: Emergency Medicine | Admitting: Emergency Medicine

## 2020-10-08 ENCOUNTER — Other Ambulatory Visit: Payer: Self-pay

## 2020-10-08 ENCOUNTER — Encounter (HOSPITAL_COMMUNITY): Payer: Self-pay | Admitting: Emergency Medicine

## 2020-10-08 DIAGNOSIS — T1591XA Foreign body on external eye, part unspecified, right eye, initial encounter: Secondary | ICD-10-CM

## 2020-10-08 DIAGNOSIS — Y999 Unspecified external cause status: Secondary | ICD-10-CM | POA: Insufficient documentation

## 2020-10-08 DIAGNOSIS — Y9289 Other specified places as the place of occurrence of the external cause: Secondary | ICD-10-CM | POA: Diagnosis not present

## 2020-10-08 DIAGNOSIS — Y93E8 Activity, other personal hygiene: Secondary | ICD-10-CM | POA: Diagnosis not present

## 2020-10-08 DIAGNOSIS — T1511XA Foreign body in conjunctival sac, right eye, initial encounter: Secondary | ICD-10-CM | POA: Diagnosis not present

## 2020-10-08 DIAGNOSIS — F17210 Nicotine dependence, cigarettes, uncomplicated: Secondary | ICD-10-CM | POA: Insufficient documentation

## 2020-10-08 DIAGNOSIS — X58XXXA Exposure to other specified factors, initial encounter: Secondary | ICD-10-CM | POA: Insufficient documentation

## 2020-10-08 DIAGNOSIS — S0501XA Injury of conjunctiva and corneal abrasion without foreign body, right eye, initial encounter: Secondary | ICD-10-CM | POA: Diagnosis not present

## 2020-10-08 MED ORDER — TETRACAINE HCL 0.5 % OP SOLN: 1.0000 [drp] | Freq: Once | OPHTHALMIC | Status: DC

## 2020-10-08 MED ORDER — POLYMYXIN B-TRIMETHOPRIM 10000-0.1 UNIT/ML-% OP SOLN: 1.0000 [drp] | OPHTHALMIC | 0 refills | Status: DC

## 2020-10-08 NOTE — Discharge Instructions (Addendum)
Can use the eye as directed.  Can continue to flush with sterile water or saline as needed. Follow-up with our on call eye doctor if continuing to have issues. Return here for any new/acute changes.

## 2020-10-08 NOTE — ED Provider Notes (Signed)
[facility] EMERGENCY DEPARTMENT Provider Note   CSN: [id number] Arrival date & time: 10/08/20  2113     History Chief Complaint  Patient presents with  . Foreign Body in Eye    Yolanda Baird is a 30 y.o. female.  The history is provided by the patient and medical records.  Foreign Body in Eye   30 y.o. F with hx of anxiety, anemia, depression, bipolar disorder, GERD, presenting to the ED after accidentally getting nail glue in her eye.  States this occurred just PTA.  States she irrigated it with bottle of saline at home with some improvement but eye is still burning and feels irritated when open.  She does not wear glasses or contact lenses.  Past Medical History:  Diagnosis Date  . Anemia   . Anxiety   . Bipolar 1 disorder (HCC)   . Depression   . GERD (gastroesophageal reflux disease)   . Headache     Patient Active Problem List   Diagnosis Date Noted  . Nexplanon in place 04/30/2020  . Contraception management 04/30/2020  . Tobacco use during pregnancy 04/21/2015  . Bipolar disorder (HCC) 04/07/2015  . History of stillbirth 04/07/2015    Past Surgical History:  Procedure Laterality Date  . TYMPANOSTOMY TUBE PLACEMENT       OB History    Gravida  5   Para  3   Term  1   Preterm  2   AB  2   Living  2     SAB  2   IAB  0   Ectopic  0   Multiple  0   Live Births  2           Family History  Problem Relation Age of Onset  . Diabetes Maternal Grandmother     Social History   Tobacco Use  . Smoking status: Current Every Day Smoker    Packs/day: 0.50    Types: Cigarettes  . Smokeless tobacco: Never Used  Substance Use Topics  . Alcohol use: No    Alcohol/week: 0.0 standard drinks  . Drug use: No    Types: Cocaine, Marijuana    Comment: quit marijuana 02/28/2015, stopped cocaine with this pregnancy    Home Medications Prior to Admission medications   Medication Sig Start Date End Date Taking? Authorizing  Provider  EPINEPHrine 0.3 mg/0.3 mL IJ SOAJ injection Inject 0.3 mg into the muscle as needed (allergic reaction).    [provider]  meloxicam (MOBIC) 7.5 MG tablet Take 1 tablet (7.5 mg total) by mouth daily. Patient not taking: Reported on 04/30/2020 03/11/19   [name], PA-C    Allergies    Food  Review of Systems   Review of Systems  Eyes: Positive for pain.  All other systems reviewed and are negative.   Physical Exam Updated Vital Signs BP (!) 154/79   Pulse 86   Temp 98.3 F (36.8 C) (Oral)   Resp 16   Ht 5\' 7"  (1.702 m)   Wt (!) 202 kg   SpO2 98%   BMI 69.75 kg/m   Physical Exam Vitals and nursing note reviewed.  Constitutional:      Appearance: She is well-developed and well-nourished.  HENT:     Head: Normocephalic and atraumatic.     Mouth/Throat:     Mouth: Oropharynx is clear and moist.  Eyes:     Extraocular Movements: EOM normal.     Conjunctiva/sclera: Conjunctivae normal.  Pupils: Pupils are equal, round, and reactive to light.     Comments: Lids fully opened, right conjunctiva is injected and tearing, EOMs intact, PERRL, no signs of FB retained in the eye, pH normal at 7  Cardiovascular:     Rate and Rhythm: Normal rate and regular rhythm.     Heart sounds: Normal heart sounds.  Pulmonary:     Effort: Pulmonary effort is normal.     Breath sounds: Normal breath sounds. No stridor. No wheezing.  Abdominal:     General: Bowel sounds are normal.     Palpations: Abdomen is soft.     Tenderness: There is no abdominal tenderness. There is no rebound.  Musculoskeletal:        General: Normal range of motion.     Cervical back: Normal range of motion.  Skin:    General: Skin is warm and dry.  Neurological:     Mental Status: She is alert and oriented to person, place, and time.  Psychiatric:        Mood and Affect: Mood is anxious.     ED Results / Procedures / Treatments   Labs (all labs ordered are listed, but only abnormal  results are displayed) Labs Reviewed - No data to display  EKG None  Radiology No results found.  Procedures Procedures   Medications Ordered in ED Medications - No data to display  ED Course  I have reviewed the triage vital signs and the nursing notes.  Pertinent labs & imaging results that were available during my care of the patient were reviewed by me and considered in my medical decision making (see chart for details).    MDM Rules/Calculators/A&P  30 y.o. F here after accidentally getting nail glue in right eye PTA.  She irrigated eye with saline at home with some improvement.  Her right eye is fully open, conjunctiva injected and eye is tearing.  PERRL, EOMs intact.  No FB visualized in the eye.  Eye's pH remains normal at 7.  Will start on prophylactic antibiotics drops and have her follow-up with on call ophthalmology if any ongoing issues.  Return here for new concerns.  Final Clinical Impression(s) / ED Diagnoses Final diagnoses:  Foreign body of right eye, initial encounter    Rx / DC Orders ED Discharge Orders         Ordered    trimethoprim-polymyxin b (POLYTRIM) ophthalmic solution  Every 4 hours        10/08/20 2328           Garlon Hatchet, PA-C 10/08/20 2334    Dione Booze, MD 10/09/20 [phone]

## 2020-10-08 NOTE — ED Triage Notes (Signed)
Patient reports super glue accidentally applied at her right eye while working on her nails this evening , reports blurred vision with irritation and lacrimation .

## 2020-10-09 DIAGNOSIS — X58XXXA Exposure to other specified factors, initial encounter: Secondary | ICD-10-CM | POA: Diagnosis not present

## 2020-10-09 DIAGNOSIS — S0501XA Injury of conjunctiva and corneal abrasion without foreign body, right eye, initial encounter: Secondary | ICD-10-CM | POA: Diagnosis not present

## 2020-10-09 DIAGNOSIS — Y999 Unspecified external cause status: Secondary | ICD-10-CM | POA: Diagnosis not present

## 2020-12-05 ENCOUNTER — Ambulatory Visit: Payer: Medicaid Other | Admitting: Obstetrics and Gynecology

## 2021-04-22 ENCOUNTER — Encounter: Payer: Self-pay | Admitting: Obstetrics and Gynecology

## 2021-04-22 ENCOUNTER — Ambulatory Visit (INDEPENDENT_AMBULATORY_CARE_PROVIDER_SITE_OTHER): Payer: Medicaid Other | Admitting: Obstetrics and Gynecology

## 2021-04-22 ENCOUNTER — Other Ambulatory Visit: Payer: Self-pay

## 2021-04-22 ENCOUNTER — Other Ambulatory Visit (HOSPITAL_COMMUNITY)
Admission: RE | Admit: 2021-04-22 | Discharge: 2021-04-22 | Disposition: A | Discharge status: Home / Self Care | Payer: Medicaid Other | Source: Ambulatory Visit | Attending: Obstetrics and Gynecology | Admitting: Obstetrics and Gynecology

## 2021-04-22 VITALS — BP 130/84 | HR 106 | Wt 235.3 lb

## 2021-04-22 DIAGNOSIS — R1010 Upper abdominal pain, unspecified: Secondary | ICD-10-CM

## 2021-04-22 DIAGNOSIS — N8110 Cystocele, unspecified: Secondary | ICD-10-CM | POA: Diagnosis not present

## 2021-04-22 DIAGNOSIS — Z01419 Encounter for gynecological examination (general) (routine) without abnormal findings: Secondary | ICD-10-CM | POA: Diagnosis not present

## 2021-04-22 NOTE — Progress Notes (Signed)
Patient complains of upper abdomen cramps that has started about a month ago. She stated that the pain is severe and at times causes her nausea.

## 2021-04-22 NOTE — Progress Notes (Signed)
GYNECOLOGY ANNUAL PREVENTATIVE CARE ENCOUNTER NOTE  History:     Yolanda Baird is a 30 y.o. [phone] female here for a routine annual gynecologic exam.  Current complaints: she has prolapse of something out of her vagina  for several months and feels like it is getting worse.  She also notes upper abdominal pain that is intermittent for the last month. She has nausea associated with it.   Denies abnormal vaginal bleeding, discharge, pelvic pain, problems with intercourse or other gynecologic concerns.      Gynecologic History Patient's last menstrual period was 04/13/2021 (approximate). Contraception: Nexplanon Last Pap: unknown - none on file. Denies h/o abnormal.   Social History:  reports that she has been smoking cigarettes. She has been smoking an average of .5 packs per day. She has never used smokeless tobacco. She reports that she does not drink alcohol and does not use drugs.  The following portions of the patient's history were reviewed and updated as appropriate: allergies, current medications, past family history, past medical history, past social history, past surgical history and problem list.  Review of Systems Pertinent items noted in HPI and remainder of comprehensive ROS otherwise negative.  Physical Exam:  BP 130/84   Pulse (!) 106   Wt 235 lb 4.8 oz (106.7 kg)   LMP 04/13/2021 (Approximate)   BMI 36.85 kg/m  CONSTITUTIONAL: Well-developed, well-nourished female in no acute distress.  HENT:  Normocephalic, atraumatic, External right and left ear normal.  EYES: Conjunctivae and EOM are normal. Pupils are equal, round, and reactive to light. No scleral icterus.  NECK: Normal range of motion, supple, no masses.  Normal thyroid.  SKIN: Skin is warm and dry. No rash noted. Not diaphoretic. No erythema. No pallor. MUSCULOSKELETAL: Normal range of motion. No tenderness.  No cyanosis, clubbing, or edema. NEUROLOGIC: Alert and oriented to person, place, and time. Normal  reflexes, muscle tone coordination.  PSYCHIATRIC: Normal mood and affect. Normal behavior. Normal judgment and thought content.  CARDIOVASCULAR: Normal heart rate noted, regular rhythm RESPIRATORY: Clear to auscultation bilaterally. Effort and breath sounds normal, no problems with respiration noted.  BREASTS: Symmetric in size. No masses, tenderness, skin changes, nipple drainage, or lymphadenopathy bilaterally. Performed in the presence of a chaperone. ABDOMEN: Soft, no distention noted.  No tenderness, rebound or guarding.  PELVIC: External genitalia normal, Vagina normal without discharge, Urethra without abnormality or discharge, no bladder tenderness, cervix normal in appearance, no CMT, uterus normal size, shape, and consistency, no adnexal masses or tenderness. She does have anterior and posterior vaginal wall prolapse. She has some apical prolapse due to the significance of the posterior wall prolapse. The anterior wall prolapse descends to 1 cm beyond hymen with valsalva. Posterior is less significant but to hymen with valsalva.   Performed in the presence of a chaperone.    Assessment and Plan:    1. Encounter for annual routine gynecological examination - Cervical cancer screening: Discussed screening Q3 years. Reviewed importance of annual exams and limits of pap smear. Pap with reflex HPV done    - GC/CT: Discussed and recommended. Pt  accepts - Birth Control: Discussed options and their risks, benefits and common side effects; discussed VTE with estrogen containing options. Desires: Nexplanon but desires no more children. She is considering permanent sterilization but this will depend on what surgery she may have for prolapse - Breast Health: Encouraged self breast awareness/exams. Teaching provided. - Follow-up: 12 months and prn  - Cytology - PAP  2. Prolapse  of anterior vaginal wall - Discussed finding of prolapse - Discussed options for intervention: Expectant management,  PFPT, pessary and surgery.  - Surgical options include native tissue repair vs mesh based surgery. Examples are AP repair, SSLS, USLS, TVH with Mc[place]s, and Sacrocolpopexy. I discussed surgery would be better avoided given her young age and I think formal PFPT would help significantly. She declines and states she does not have the time to go for PFPT. She has tried IT sales professional and doesn't feel like they have helped.  - We discussed the impact of smoking on her health in general and also on her prolapse. She is precontemplative.  - Recommended referral to Urogynecology - She would like to consider her options, Surgery, and Referral to specialist - Ambulatory referral to Urogynecology  3. Upper abdominal pain - Discussed this is possibly her gall bladder but may be reflux etc. Discussed I would have her see primary care. She does not have one yet. Discussed f/u soon with them given her symptoms.  - Ambulatory referral to [facility]   Routine preventative health maintenance measures emphasized. Please refer to After Visit Summary for other counseling recommendations.      Milas Hock, MD, FACOG Obstetrician & Gynecologist, [facility] for [facility], [facility]

## 2021-04-24 LAB — CYTOLOGY - PAP
Comment: NEGATIVE
Diagnosis: NEGATIVE
Diagnosis: REACTIVE
High risk HPV: NEGATIVE

## 2021-05-10 ENCOUNTER — Inpatient Hospital Stay (HOSPITAL_COMMUNITY)
Admission: AD | Admit: 2021-05-10 | Discharge: 2021-05-10 | Discharge status: Against Medical Advice | Payer: Medicaid Other | Attending: Family Medicine | Admitting: Family Medicine

## 2021-05-10 ENCOUNTER — Encounter (HOSPITAL_COMMUNITY): Payer: Self-pay | Admitting: Family Medicine

## 2021-05-10 ENCOUNTER — Other Ambulatory Visit: Payer: Self-pay

## 2021-05-10 DIAGNOSIS — N390 Urinary tract infection, site not specified: Secondary | ICD-10-CM | POA: Diagnosis not present

## 2021-05-10 DIAGNOSIS — R1032 Left lower quadrant pain: Secondary | ICD-10-CM | POA: Diagnosis not present

## 2021-05-10 DIAGNOSIS — Z3202 Encounter for pregnancy test, result negative: Secondary | ICD-10-CM

## 2021-05-10 DIAGNOSIS — R109 Unspecified abdominal pain: Secondary | ICD-10-CM

## 2021-05-10 DIAGNOSIS — R102 Pelvic and perineal pain: Secondary | ICD-10-CM | POA: Diagnosis not present

## 2021-05-10 NOTE — ED Notes (Signed)
Pt stated that she is leaving. NT informed her that she was now the longest wait and should wait to see the doctor. Pt still decided to leave.

## 2021-05-10 NOTE — MAU Note (Signed)
Report given to ED RN [place]

## 2021-05-10 NOTE — ED Triage Notes (Signed)
Pt from MAU for further eval of abd/back pain. Seen at [facility] ED this AM, negative HCG. Cleared by MAU, RX for Keflex & "pain medicine" for UTI, found to have prolapsed bladder w OB. Pt noted to have labile affect w staff  8/10 abd/back pain, "just hurts"

## 2021-05-10 NOTE — MAU Note (Addendum)
Presents with c/o lower back pain that wraps around into her abdomen.  States seen in ED this morning for same.  States has Hx of prolapse bladder and feels pelvic pressure.  Reports given antibiotic for UTI.  Report no BM in 4 days.

## 2021-05-10 NOTE — MAU Provider Note (Signed)
Event Date/Time   First Provider Initiated Contact with Patient 05/10/21 1449      S Ms. Yolanda Baird is a 30 y.o. [id number] patient who presents to MAU today with complaint of pain radiating from pelvis to abdomen.  Patient was recently seen by OB/GYN and found to have a prolapse of the anterior vaginal wall and was seen in the ED earlier this morning in a different system and patient states she was provided a prescription for pain medication and antibiotic for a UTI and sent home without a pelvic exam. Patient states she has not slept or eaten in 4 days and wants the pain to stop. Patient states she has vomited bile acid occasionally. Patient states she is not interested in pain medication at this time. Patient with labile mood switching quickly between laughing and crying in seconds. Patient has bipolar disorder on problem list. Qualitative pregnancy test done in ED this morning and is negative.  O BP 125/86 (BP Location: Right Arm)   Pulse 96   Temp 98.7 F (37.1 C)   Resp 20   Ht 5\' 7"  (1.702 m)   Wt 103.9 kg   LMP 04/13/2021 (Approximate)   SpO2 96%   BMI 35.88 kg/m   Patient Vitals for the past 24 hrs:  BP Temp Pulse Resp SpO2 Height Weight  05/10/21 1444 125/86 98.7 F (37.1 C) 96 20 96 % 5\' 7"  (1.702 m) 103.9 kg   Physical Exam Vitals and nursing note reviewed.  Constitutional:      General: She is not in acute distress.    Appearance: Normal appearance. She is not ill-appearing, toxic-appearing or diaphoretic.  HENT:     Head: Normocephalic and atraumatic.  Pulmonary:     Effort: Pulmonary effort is normal.  Neurological:     Mental Status: She is alert and oriented to person, place, and time.  Psychiatric:        Mood and Affect: Affect is labile and tearful.        Behavior: Behavior is cooperative.        Thought Content: Thought content normal.   A Medical screening exam started hCG negative at ED this morning Called and spoke with 07/10/21, who accepts  patient in transfer  P Discharge from MAU in stable condition Transfer to ED for further evaluation Warning signs for worsening condition that would warrant emergency follow-up discussed Patient may return to MAU as needed   Delorus Langwell, Lurena Joiner, NP 05/10/2021 3:05 PM

## 2021-06-18 ENCOUNTER — Encounter: Payer: Self-pay | Admitting: *Deleted

## 2021-06-23 ENCOUNTER — Ambulatory Visit: Payer: Medicaid Other | Admitting: Obstetrics and Gynecology

## 2021-09-20 DIAGNOSIS — R112 Nausea with vomiting, unspecified: Secondary | ICD-10-CM | POA: Diagnosis not present

## 2021-09-20 DIAGNOSIS — Z20822 Contact with and (suspected) exposure to covid-19: Secondary | ICD-10-CM | POA: Diagnosis not present

## 2022-11-18 DIAGNOSIS — R102 Pelvic and perineal pain: Secondary | ICD-10-CM | POA: Diagnosis not present

## 2022-11-18 DIAGNOSIS — Z0001 Encounter for general adult medical examination with abnormal findings: Secondary | ICD-10-CM | POA: Diagnosis not present

## 2022-11-18 DIAGNOSIS — N920 Excessive and frequent menstruation with regular cycle: Secondary | ICD-10-CM | POA: Diagnosis not present

## 2022-11-18 DIAGNOSIS — N939 Abnormal uterine and vaginal bleeding, unspecified: Secondary | ICD-10-CM | POA: Diagnosis not present

## 2022-11-22 DIAGNOSIS — Z7689 Persons encountering health services in other specified circumstances: Secondary | ICD-10-CM | POA: Diagnosis not present

## 2022-11-22 DIAGNOSIS — R7303 Prediabetes: Secondary | ICD-10-CM | POA: Diagnosis not present

## 2022-11-22 DIAGNOSIS — Z8261 Family history of arthritis: Secondary | ICD-10-CM | POA: Diagnosis not present

## 2022-11-22 DIAGNOSIS — Z8659 Personal history of other mental and behavioral disorders: Secondary | ICD-10-CM | POA: Diagnosis not present

## 2022-11-22 DIAGNOSIS — F419 Anxiety disorder, unspecified: Secondary | ICD-10-CM | POA: Diagnosis not present

## 2022-11-22 DIAGNOSIS — M5432 Sciatica, left side: Secondary | ICD-10-CM | POA: Diagnosis not present

## 2022-11-22 DIAGNOSIS — F319 Bipolar disorder, unspecified: Secondary | ICD-10-CM | POA: Diagnosis not present

## 2022-11-22 DIAGNOSIS — M5442 Lumbago with sciatica, left side: Secondary | ICD-10-CM | POA: Diagnosis not present

## 2022-11-22 DIAGNOSIS — G8929 Other chronic pain: Secondary | ICD-10-CM | POA: Diagnosis not present

## 2022-11-22 DIAGNOSIS — M25561 Pain in right knee: Secondary | ICD-10-CM | POA: Diagnosis not present

## 2022-11-22 DIAGNOSIS — M5441 Lumbago with sciatica, right side: Secondary | ICD-10-CM | POA: Diagnosis not present

## 2022-12-06 DIAGNOSIS — N920 Excessive and frequent menstruation with regular cycle: Secondary | ICD-10-CM | POA: Diagnosis not present

## 2022-12-13 ENCOUNTER — Ambulatory Visit: Payer: Medicaid Other | Admitting: Obstetrics and Gynecology

## 2022-12-23 DIAGNOSIS — N920 Excessive and frequent menstruation with regular cycle: Secondary | ICD-10-CM | POA: Diagnosis not present

## 2022-12-28 DIAGNOSIS — N946 Dysmenorrhea, unspecified: Secondary | ICD-10-CM | POA: Diagnosis not present

## 2022-12-28 DIAGNOSIS — N8000 Endometriosis of the uterus, unspecified: Secondary | ICD-10-CM | POA: Diagnosis not present

## 2022-12-28 DIAGNOSIS — N72 Inflammatory disease of cervix uteri: Secondary | ICD-10-CM | POA: Diagnosis not present

## 2022-12-28 DIAGNOSIS — N920 Excessive and frequent menstruation with regular cycle: Secondary | ICD-10-CM | POA: Diagnosis not present

## 2023-01-05 DIAGNOSIS — N946 Dysmenorrhea, unspecified: Secondary | ICD-10-CM | POA: Diagnosis not present

## 2023-02-03 DIAGNOSIS — N938 Other specified abnormal uterine and vaginal bleeding: Secondary | ICD-10-CM | POA: Diagnosis not present

## 2023-02-03 DIAGNOSIS — B9689 Other specified bacterial agents as the cause of diseases classified elsewhere: Secondary | ICD-10-CM | POA: Diagnosis not present

## 2023-02-03 DIAGNOSIS — N760 Acute vaginitis: Secondary | ICD-10-CM | POA: Diagnosis not present

## 2023-02-10 DIAGNOSIS — N760 Acute vaginitis: Secondary | ICD-10-CM | POA: Diagnosis not present

## 2023-02-10 DIAGNOSIS — B9689 Other specified bacterial agents as the cause of diseases classified elsewhere: Secondary | ICD-10-CM | POA: Diagnosis not present

## 2023-02-10 DIAGNOSIS — A599 Trichomoniasis, unspecified: Secondary | ICD-10-CM | POA: Diagnosis not present

## 2023-04-01 DIAGNOSIS — Z90710 Acquired absence of both cervix and uterus: Secondary | ICD-10-CM | POA: Diagnosis not present

## 2023-04-01 DIAGNOSIS — N898 Other specified noninflammatory disorders of vagina: Secondary | ICD-10-CM | POA: Diagnosis not present

## 2024-01-09 DIAGNOSIS — D72829 Elevated white blood cell count, unspecified: Secondary | ICD-10-CM | POA: Diagnosis not present

## 2024-01-09 DIAGNOSIS — R1110 Vomiting, unspecified: Secondary | ICD-10-CM | POA: Diagnosis not present

## 2024-01-09 DIAGNOSIS — R519 Headache, unspecified: Secondary | ICD-10-CM | POA: Diagnosis not present

## 2024-01-09 DIAGNOSIS — R197 Diarrhea, unspecified: Secondary | ICD-10-CM | POA: Diagnosis not present

## 2024-01-09 DIAGNOSIS — R109 Unspecified abdominal pain: Secondary | ICD-10-CM | POA: Diagnosis not present

## 2024-01-09 DIAGNOSIS — F17210 Nicotine dependence, cigarettes, uncomplicated: Secondary | ICD-10-CM | POA: Diagnosis not present

## 2024-01-10 DIAGNOSIS — R197 Diarrhea, unspecified: Secondary | ICD-10-CM | POA: Diagnosis not present

## 2024-01-10 DIAGNOSIS — R519 Headache, unspecified: Secondary | ICD-10-CM | POA: Diagnosis not present

## 2024-01-10 DIAGNOSIS — R1110 Vomiting, unspecified: Secondary | ICD-10-CM | POA: Diagnosis not present

## 2024-01-10 DIAGNOSIS — D72829 Elevated white blood cell count, unspecified: Secondary | ICD-10-CM | POA: Diagnosis not present

## 2024-01-10 DIAGNOSIS — R109 Unspecified abdominal pain: Secondary | ICD-10-CM | POA: Diagnosis not present

## 2024-02-13 DIAGNOSIS — M7989 Other specified soft tissue disorders: Secondary | ICD-10-CM | POA: Diagnosis not present

## 2024-02-13 DIAGNOSIS — S0012XA Contusion of left eyelid and periocular area, initial encounter: Secondary | ICD-10-CM | POA: Diagnosis not present

## 2024-02-13 DIAGNOSIS — F17210 Nicotine dependence, cigarettes, uncomplicated: Secondary | ICD-10-CM | POA: Diagnosis not present

## 2024-02-13 DIAGNOSIS — S0285XA Fracture of orbit, unspecified, initial encounter for closed fracture: Secondary | ICD-10-CM | POA: Diagnosis not present

## 2024-02-13 DIAGNOSIS — W500XXA Accidental hit or strike by another person, initial encounter: Secondary | ICD-10-CM | POA: Diagnosis not present

## 2024-02-13 DIAGNOSIS — H05222 Edema of left orbit: Secondary | ICD-10-CM | POA: Diagnosis not present

## 2024-02-13 DIAGNOSIS — F419 Anxiety disorder, unspecified: Secondary | ICD-10-CM | POA: Diagnosis not present

## 2024-02-13 DIAGNOSIS — S0093XA Contusion of unspecified part of head, initial encounter: Secondary | ICD-10-CM | POA: Diagnosis not present

## 2024-04-04 DIAGNOSIS — Z8659 Personal history of other mental and behavioral disorders: Secondary | ICD-10-CM | POA: Diagnosis not present

## 2024-04-04 DIAGNOSIS — F419 Anxiety disorder, unspecified: Secondary | ICD-10-CM | POA: Diagnosis not present

## 2024-04-04 DIAGNOSIS — R7303 Prediabetes: Secondary | ICD-10-CM | POA: Diagnosis not present

## 2024-08-02 DIAGNOSIS — J069 Acute upper respiratory infection, unspecified: Secondary | ICD-10-CM | POA: Diagnosis not present

## 2024-08-02 DIAGNOSIS — Z20822 Contact with and (suspected) exposure to covid-19: Secondary | ICD-10-CM | POA: Diagnosis not present

## 2024-08-02 DIAGNOSIS — R21 Rash and other nonspecific skin eruption: Secondary | ICD-10-CM | POA: Diagnosis not present
